# Patient Record
Sex: Male | Born: 1965 | Race: White | Hispanic: No | Marital: Married | Smoking: Former smoker
Health system: Southern US, Community
[De-identification: ages and names within clinical notes are randomized; demographics above are authoritative.]

## PROBLEM LIST (undated history)

## (undated) DIAGNOSIS — E079 Disorder of thyroid, unspecified: Secondary | ICD-10-CM

## (undated) DIAGNOSIS — R519 Headache, unspecified: Secondary | ICD-10-CM

## (undated) DIAGNOSIS — R51 Headache: Secondary | ICD-10-CM

## (undated) DIAGNOSIS — F32A Depression, unspecified: Secondary | ICD-10-CM

## (undated) DIAGNOSIS — F329 Major depressive disorder, single episode, unspecified: Secondary | ICD-10-CM

## (undated) DIAGNOSIS — M1990 Unspecified osteoarthritis, unspecified site: Secondary | ICD-10-CM

## (undated) DIAGNOSIS — K219 Gastro-esophageal reflux disease without esophagitis: Secondary | ICD-10-CM

## (undated) DIAGNOSIS — K2270 Barrett's esophagus without dysplasia: Secondary | ICD-10-CM

## (undated) DIAGNOSIS — K469 Unspecified abdominal hernia without obstruction or gangrene: Secondary | ICD-10-CM

## (undated) DIAGNOSIS — T7840XA Allergy, unspecified, initial encounter: Secondary | ICD-10-CM

## (undated) HISTORY — DX: Headache: R51

## (undated) HISTORY — DX: Unspecified osteoarthritis, unspecified site: M19.90

## (undated) HISTORY — DX: Barrett's esophagus without dysplasia: K22.70

## (undated) HISTORY — DX: Major depressive disorder, single episode, unspecified: F32.9

## (undated) HISTORY — DX: Allergy, unspecified, initial encounter: T78.40XA

## (undated) HISTORY — DX: Unspecified abdominal hernia without obstruction or gangrene: K46.9

## (undated) HISTORY — DX: Depression, unspecified: F32.A

## (undated) HISTORY — DX: Disorder of thyroid, unspecified: E07.9

## (undated) HISTORY — DX: Headache, unspecified: R51.9

## (undated) HISTORY — DX: Gastro-esophageal reflux disease without esophagitis: K21.9

---
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
## 2009-02-13 ENCOUNTER — Ambulatory Visit: Payer: Self-pay | Admitting: Family Medicine

## 2009-02-13 ENCOUNTER — Ambulatory Visit: Payer: Self-pay | Admitting: Interventional Radiology

## 2009-02-13 ENCOUNTER — Ambulatory Visit (HOSPITAL_BASED_OUTPATIENT_CLINIC_OR_DEPARTMENT_OTHER): Admission: RE | Admit: 2009-02-13 | Discharge: 2009-02-13 | Payer: Self-pay | Admitting: Family Medicine

## 2009-02-13 DIAGNOSIS — S1093XA Contusion of unspecified part of neck, initial encounter: Secondary | ICD-10-CM

## 2009-02-13 DIAGNOSIS — S0003XA Contusion of scalp, initial encounter: Secondary | ICD-10-CM | POA: Insufficient documentation

## 2009-02-13 DIAGNOSIS — S0083XA Contusion of other part of head, initial encounter: Secondary | ICD-10-CM

## 2009-07-20 ENCOUNTER — Ambulatory Visit: Payer: Self-pay | Admitting: Family

## 2009-07-20 ENCOUNTER — Telehealth: Payer: Self-pay | Admitting: Family

## 2009-07-20 ENCOUNTER — Ambulatory Visit (HOSPITAL_BASED_OUTPATIENT_CLINIC_OR_DEPARTMENT_OTHER): Admission: RE | Admit: 2009-07-20 | Discharge: 2009-07-20 | Payer: Self-pay | Admitting: Internal Medicine

## 2009-07-20 ENCOUNTER — Ambulatory Visit: Payer: Self-pay | Admitting: Diagnostic Radiology

## 2009-07-20 DIAGNOSIS — E039 Hypothyroidism, unspecified: Secondary | ICD-10-CM

## 2009-07-20 DIAGNOSIS — M1710 Unilateral primary osteoarthritis, unspecified knee: Secondary | ICD-10-CM | POA: Insufficient documentation

## 2009-07-20 LAB — CONVERTED CEMR LAB
BUN: 12 mg/dL (ref 6–23)
Basophils Absolute: 0 10*3/uL (ref 0.0–0.1)
Basophils Relative: 1 % (ref 0–1)
CO2: 26 meq/L (ref 19–32)
Calcium: 10.2 mg/dL (ref 8.4–10.5)
Chloride: 101 meq/L (ref 96–112)
Creatinine, Ser: 1.28 mg/dL (ref 0.40–1.50)
Eosinophils Absolute: 0.1 10*3/uL (ref 0.0–0.7)
Eosinophils Relative: 2 % (ref 0–5)
Glucose, Bld: 109 mg/dL — ABNORMAL HIGH (ref 70–99)
HCT: 45 % (ref 39.0–52.0)
Hemoglobin: 14.8 g/dL (ref 13.0–17.0)
Lymphocytes Relative: 34 % (ref 12–46)
Lymphs Abs: 2.8 10*3/uL (ref 0.7–4.0)
MCHC: 32.9 g/dL (ref 30.0–36.0)
MCV: 90.2 fL (ref 78.0–100.0)
Monocytes Absolute: 0.9 10*3/uL (ref 0.1–1.0)
Monocytes Relative: 10 % (ref 3–12)
Neutro Abs: 4.4 10*3/uL (ref 1.7–7.7)
Neutrophils Relative %: 53 % (ref 43–77)
Platelets: 237 10*3/uL (ref 150–400)
Potassium: 4.1 meq/L (ref 3.5–5.3)
RBC: 4.99 M/uL (ref 4.22–5.81)
RDW: 12.9 % (ref 11.5–15.5)
Sodium: 139 meq/L (ref 135–145)
TSH: 5.633 microintl units/mL — ABNORMAL HIGH (ref 0.350–4.500)
WBC: 8.3 10*3/uL (ref 4.0–10.5)

## 2009-07-23 ENCOUNTER — Encounter: Payer: Self-pay | Admitting: Family

## 2009-07-24 ENCOUNTER — Telehealth: Payer: Self-pay | Admitting: Family

## 2009-08-14 ENCOUNTER — Encounter: Payer: Self-pay | Admitting: Family

## 2009-08-14 LAB — CONVERTED CEMR LAB
Hgb A1c MFr Bld: 5.3 % (ref 4.6–6.1)
TSH: 3.444 microintl units/mL (ref 0.350–4.500)

## 2009-08-16 ENCOUNTER — Encounter: Payer: Self-pay | Admitting: Family

## 2009-08-21 ENCOUNTER — Ambulatory Visit: Payer: Self-pay | Admitting: Family

## 2009-08-21 DIAGNOSIS — L259 Unspecified contact dermatitis, unspecified cause: Secondary | ICD-10-CM

## 2009-09-21 ENCOUNTER — Telehealth (INDEPENDENT_AMBULATORY_CARE_PROVIDER_SITE_OTHER): Payer: Self-pay | Admitting: *Deleted

## 2009-09-21 ENCOUNTER — Encounter: Payer: Self-pay | Admitting: Family

## 2009-11-19 ENCOUNTER — Ambulatory Visit: Payer: Self-pay | Admitting: Family

## 2009-11-19 LAB — CONVERTED CEMR LAB: TSH: 1.375 microintl units/mL (ref 0.350–4.500)

## 2009-11-20 ENCOUNTER — Encounter: Payer: Self-pay | Admitting: Family

## 2010-06-28 ENCOUNTER — Telehealth: Payer: Self-pay | Admitting: Family

## 2010-07-03 ENCOUNTER — Ambulatory Visit: Payer: Self-pay | Admitting: Family

## 2010-07-03 DIAGNOSIS — K219 Gastro-esophageal reflux disease without esophagitis: Secondary | ICD-10-CM

## 2010-07-03 LAB — CONVERTED CEMR LAB: TSH: 2.295 microintl units/mL (ref 0.350–4.500)

## 2010-07-04 ENCOUNTER — Telehealth: Payer: Self-pay | Admitting: Family

## 2010-10-15 NOTE — Assessment & Plan Note (Signed)
Summary: Discuss HPV, THyroid check,- jr   Vital Signs:  Patient profile:   45 year old male Weight:      219 pounds BMI:     30.65 Temp:     98.0 degrees F oral Pulse rate:   72 / minute Pulse rhythm:   regular Resp:     16 per minute BP sitting:   100 / 78  (right arm) Cuff size:   regular  Vitals Entered By: Mervin Kung CMA (November 19, 2009 4:10 PM) CC: room 4  Needs thyroid checked.  Wife has been diagnosed with HPV.   Primary Care Provider:  Seymour Bars DO  CC:  room 4  Needs thyroid checked.  Wife has been diagnosed with HPV.Marland Kitchen  History of Present Illness: Mr Hidalgo is a 45 year old male with history of hypothyroidism.  Patient denies fatigue, heart palpitations. Has gained 5 lbs since since his last visit.  Has been taking synthroid.  No complaints.  Also wants to discuss HPV- tells me that his wife has hx of HPV and is s/p hysterectomy.  He denies any penile warts or lesions.    Allergies (verified): No Known Drug Allergies  Physical Exam  General:  Well-developed,well-nourished,in no acute distress; alert,appropriate and cooperative throughout examination Lungs:  Normal respiratory effort, chest expands symmetrically. Lungs are clear to auscultation, no crackles or wheezes. Heart:  Normal rate and regular rhythm. S1 and S2 normal without gallop, murmur, click, rub or other extra sounds.   Impression & Recommendations:  Problem # 1:  HYPOTHYROIDISM (ICD-244.9) Assessment Comment Only Will check TSH His updated medication list for this problem includes:    Levothroid 112 Mcg Tabs (Levothyroxine sodium) ..... 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  Notes right leg is worse than the left.  Will refer to ortho, plain films back in November were unremarkable. His updated medication list for this problem  includes:    Mobic 7.5 Mg Tabs (Meloxicam) ..... One tablet by mouth daily  Orders: Orthopedic Referral (Ortho)  Problem # 3:  COUNSELING OTHER SEXUALLY TRANSMITTED DISEASES (ICD-V65.45) Assessment: Comment Only Patient denies any penile lesions.  It is possible that he is a carrier for HPV and should keep this in mind if he has future sexual partners.  Other wise- I instructed patient to see [place] if he develops any penile warts/lesions.  He verbalizes understanding.  Complete Medication List: 1)  Omeprazole 40 Mg Cpdr (Omeprazole) .Marland Kitchen.. 1 by mouth daily 2)  Levothroid 112 Mcg Tabs (Levothyroxine sodium) .... One tablet by mouth daily 3)  Mobic 7.5 Mg Tabs (Meloxicam) .... One tablet by mouth daily  Patient Instructions: 1)  You will be contacted about your referral to Orthopedics.  2)  Pls complete your labs this afternoon.   Current Allergies (reviewed today): No known allergies

## 2010-10-15 NOTE — Progress Notes (Signed)
Summary: lab result, refill  Phone Note Outgoing Call   Summary of Call: Please call patient and let him know that his TSH is normal and I have sent rx refill to his pharmacy.  I am not changing his dose. Initial call taken by: Lemont Fillers FNP,  July 04, 2010 2:37 PM    Prescriptions: LEVOTHROID 112 MCG TABS (LEVOTHYROXINE SODIUM) one tablet by mouth daily  #90 x 1   Entered by:   Mervin Kung CMA (AAMA)   Authorized by:   Lemont Fillers FNP   Signed by:   Mervin Kung CMA (AAMA) on 07/05/2010   Method used:   Electronically to        [facility] (retail)             , [place]         Ph: [phone]       Fax: [phone]   RxID:   [id number] LEVOTHROID 112 MCG TABS (LEVOTHYROXINE SODIUM) one tablet by mouth daily  #90 x 1   Entered and Authorized by:   Lemont Fillers FNP   Signed by:   Lemont Fillers FNP on 07/04/2010   Method used:   Electronically to        [facility]. [phone]* (retail)       [address]       [place], [place]  [address]       Ph: [phone] or [phone]       Fax: [phone]   RxID:   [id number]

## 2010-10-15 NOTE — Progress Notes (Signed)
Summary: Please resend Rx. and refills to [facility]   Phone Note Refill Request Message from:  Patient on September 21, 2009 10:25 AM  Patient needs all his presriptions and refills resent to  [facility]. Their fax # is [phone], their Phone # is [phone].  Pt states if you can do 3 months at a time it will save him money. He no longer uses [facility] and has been switched to [facility] due to insurance  Initial call taken by: Michaelle Copas,  September 21, 2009 10:28 AM  Follow-up for Phone Call        filled out refill form.  [facility]- could you please fax to pharmacy.  Thanks Follow-up by: Lemont Fillers FNP,  September 21, 2009 12:29 PM  Additional Follow-up for Phone Call Additional follow up Details #1::        prescription faxed to [facility].Doristine Devoid  September 21, 2009 12:39 PM     Prescriptions: OMEPRAZOLE 40 MG CPDR (OMEPRAZOLE) 1 by mouth daily  #90 x 2   Entered by:   Doristine Devoid   Authorized by:   Lemont Fillers FNP   Signed by:   Doristine Devoid on 09/21/2009   Method used:   Historical   RxID:   [id number] MOBIC 7.5 MG TABS (MELOXICAM) one tablet by mouth daily  #90 x 2   Entered by:   Doristine Devoid   Authorized by:   Lemont Fillers FNP   Signed by:   Doristine Devoid on 09/21/2009   Method used:   Historical   RxID:   [id number] LEVOTHROID 112 MCG TABS (LEVOTHYROXINE SODIUM) one tablet by mouth daily  #90 x 2   Entered by:   Doristine Devoid   Authorized by:   Lemont Fillers FNP   Signed by:   Doristine Devoid on 09/21/2009   Method used:   Historical   RxID:   [id number]

## 2010-10-15 NOTE — Progress Notes (Signed)
Summary: refill --Levothroid  Phone Note Refill Request Message from:  Fax from Pharmacy on June 28, 2010 8:34 AM  Refills Requested: Medication #1:  LEVOTHROID 112 MCG TABS one tablet by mouth daily   Dosage confirmed as above?Dosage Confirmed   Brand Name Necessary? No   Supply Requested: 3 months   Last Refilled: 03/22/2010 [facility] [address] [place] [address] [phone] fax [phone] Pt last seen March 2011.   Method Requested: Electronic Next Appointment Scheduled: none Initial call taken by: Roselle Locus,  June 28, 2010 8:34 AM  Follow-up for Phone Call        May give 30 day supply only.  Please call pt to schedule follow up visit.  We need to recheck his thyroid studies to make sure that this is still the right dose for him. Follow-up by: Lemont Fillers FNP,  June 28, 2010 2:37 PM     Appended Document: refill --Levothroid Please call pt to arrange a follow up visit.   Appended Document: refill --Levothroid Spoke to pt. He scheduled appt with Melissa for tomorrow at 3:30pm. We will wait on refill until lab result comes back as pt states he has a weeks supply of med left.

## 2010-10-15 NOTE — Letter (Signed)
   [facility] [address] [place], [place] [address]    November 20, 2009   [facility] [address] [place], [place] [address]  RE:  LAB RESULTS  Dear  Mr. WIRT,  The following is an interpretation of your most recent lab tests.  Please take note of any instructions provided or changes to medications that have resulted from your lab work.  ELECTROLYTES:  Good - no changes needed  KIDNEY FUNCTION TESTS:  Good - no changes needed     Sincerely Yours,    Lemont Fillers FNP

## 2010-10-15 NOTE — Assessment & Plan Note (Signed)
Summary: follow up / tf,cma--Rm 4   Vital Signs:  Patient profile:   45 year old male Height:      71 inches Weight:      217 pounds BMI:     30.37 Temp:     97.8 degrees F oral Pulse rate:   78 / minute Pulse rhythm:   regular Resp:     16 per minute BP sitting:   110 / 78  (right arm) Cuff size:   large  Vitals Entered By: Mervin Kung CMA Duncan Dull) (July 03, 2010 3:17 PM) CC: Rm 4  follow up of hypothyroidism. Is Patient Diabetic? No Pain Assessment Patient in pain? no      Comments Pt will need refill on Levothroid if labs still in range. Prefers to have refills through [facility]. Mobic was stopped by Ortho. Pt states all other meds doses and directions are correct. Nicki Guadalajara Fergerson CMA Duncan Dull)  July 03, 2010 3:22 PM    Primary Care Provider:  Lemont Fillers FNP  CC:  Rm 4  follow up of hypothyroidism.Marland Kitchen  History of Present Illness: Terry Fox is a 45 year old male who presents today for follow up. He has no complaints today.  1. Hypothyroid- Pt has lost 2 pounds since his last visit.  Reports that he generally feels well, denies cold intolerance.  Appetite is good.  Denies swelling.  Taking levothroid daily as prescribed.    2. GERD- reports + chronic gerd symptoms prior to starting PPI, now well controlled.  Allergies (verified): No Known Drug Allergies  Past History:  Past Medical History: Last updated: 02/13/2009 GERD Hypothyroidism  [facility] [place] Depression in his 54s frequent HAs  Past Surgical History: Last updated: 02/13/2009 tonsillectomy 1980s  Review of Systems       see HPI  Physical Exam  General:  Well-developed,well-nourished,in no acute distress; alert,appropriate and cooperative throughout examination Lungs:  Normal respiratory effort, chest expands symmetrically. Lungs are clear to auscultation, no crackles or wheezes. Heart:  Normal rate and regular rhythm. S1 and S2 normal without gallop, murmur, click, rub or other  extra sounds. Extremities:  No peripheral edema   Impression & Recommendations:  Problem # 1:  HYPOTHYROIDISM (ICD-244.9) Assessment Comment Only Will check TSH, plan to continue levothroid. Flu shot today. His updated medication list for this problem includes:    Levothroid 112 Mcg Tabs (Levothyroxine sodium) ..... One tablet by mouth daily  Orders: TLB-TSH (Thyroid Stimulating Hormone) (84443-TSH)  Problem # 2:  GERD (ICD-530.81) Assessment: Improved Stable, continue PPI His updated medication list for this problem includes:    Omeprazole 40 Mg Cpdr (Omeprazole) .Marland Kitchen... 1 by mouth daily  Complete Medication List: 1)  Omeprazole 40 Mg Cpdr (Omeprazole) .Marland Kitchen.. 1 by mouth daily 2)  Levothroid 112 Mcg Tabs (Levothyroxine sodium) .... One tablet by mouth daily  Other Orders: Flu Vaccine 36yrs + (25956) Admin 1st Vaccine (38756)  Patient Instructions: 1)  Please complete your lab work downstairs. 2)  Follow up in 6 months, sooner if problems or concerns.   Orders Added: 1)  TLB-TSH (Thyroid Stimulating Hormone) [84443-TSH] 2)  Est. Patient Level III [43329] 3)  Flu Vaccine 50yrs + [90658] 4)  Admin 1st Vaccine [51884]   Immunizations Administered:  Influenza Vaccine # 1:    Vaccine Type: Fluvax 3+    Site: left deltoid    Mfr: GlaxoSmithKline    Dose: 0.5 ml    Route: IM    Given by: Glendell Docker CMA  Exp. Date: 03/15/2011    Lot #: [id number]    VIS given: 04/09/10 version given July 03, 2010.  Flu Vaccine Consent Questions:    Do you have a history of severe allergic reactions to this vaccine? no    Any prior history of allergic reactions to egg and/or gelatin? no    Do you have a sensitivity to the preservative Thimersol? no    Do you have a past history of Guillan-Barre Syndrome? no    Do you currently have an acute febrile illness? no    Have you ever had a severe reaction to latex? no    Vaccine information given and explained to patient?  yes   Immunizations Administered:  Influenza Vaccine # 1:    Vaccine Type: Fluvax 3+    Site: left deltoid    Mfr: GlaxoSmithKline    Dose: 0.5 ml    Route: IM    Given by: Glendell Docker CMA    Exp. Date: 03/15/2011    Lot #: [id number]    VIS given: 04/09/10 version given July 03, 2010.  Current Allergies (reviewed today): No known allergies

## 2010-10-15 NOTE — Medication Information (Signed)
Summary: Levothroid, Meloxicam, & Omeprazole/[facility]  Levothroid, Meloxicam, & Omeprazole/[facility]   Imported By: Lanelle Bal 09/26/2009 14:19:43  _____________________________________________________________________  External Attachment:    Type:   Image     Comment:   External Document

## 2010-12-27 ENCOUNTER — Ambulatory Visit (INDEPENDENT_AMBULATORY_CARE_PROVIDER_SITE_OTHER): Payer: PRIVATE HEALTH INSURANCE | Admitting: Family

## 2010-12-27 ENCOUNTER — Encounter: Payer: Self-pay | Admitting: Family

## 2010-12-27 ENCOUNTER — Ambulatory Visit: Payer: Self-pay | Admitting: Family

## 2010-12-27 VITALS — BP 100/70 | HR 67 | Temp 98.0°F | Resp 18 | Ht 71.0 in | Wt 216.0 lb

## 2010-12-27 DIAGNOSIS — L237 Allergic contact dermatitis due to plants, except food: Secondary | ICD-10-CM | POA: Insufficient documentation

## 2010-12-27 DIAGNOSIS — E039 Hypothyroidism, unspecified: Secondary | ICD-10-CM

## 2010-12-27 DIAGNOSIS — L255 Unspecified contact dermatitis due to plants, except food: Secondary | ICD-10-CM

## 2010-12-27 MED ORDER — BETAMETHASONE DIPROPIONATE 0.05 % EX CREA: TOPICAL_CREAM | Freq: Two times a day (BID) | CUTANEOUS | Status: DC

## 2010-12-27 NOTE — Assessment & Plan Note (Signed)
At this point, I do not think that he would benefit much from oral steroids as his symptoms are not that severe.  Will treat with topical steroid cream.

## 2010-12-27 NOTE — Progress Notes (Signed)
  Subjective:    Patient ID: Terry Fox, male    DOB: Feb 07, 1966, 45 y.o.   MRN: [id number]  HPI  Terry Fox is a 45 yr old male who presents today  with complaint of poison ivy.  Pt was doing yard work Saturday and used his gloves.  Tried to avoid the poison ivy in his yard.  He then developed rash on Monday.  Has been applying some benadryl cream which has helped.  Hypothyroidism- reports + compliance with levothyroxine.  Has lost 4 pounds.    Review of Systems See HPI  Past Medical History  Diagnosis Date  . GERD (gastroesophageal reflux disease)   . Thyroid disease     hypothyroidism  . Depression     in his 29's  . Generalized headaches     History   Social History  . Marital Status: Married    Spouse Name: N/A    Number of Children: 6  . Years of Education: N/A   Occupational History  . Retired Arts development officer    Social History Main Topics  . Smoking status: Never Smoker   . Smokeless tobacco: Current User  . Alcohol Use: Not on file  . Drug Use: Not on file  . Sexually Active: Not on file   Other Topics Concern  . Not on file   Social History Narrative  . No narrative on file    Past Surgical History  Procedure Date  . Tonsillectomy 1980's    Family History  Problem Relation Age of Onset  . Arthritis Father     No Known Allergies  Current Outpatient Prescriptions on File Prior to Visit  Medication Sig Dispense Refill  . levothyroxine (SYNTHROID, LEVOTHROID) 112 MCG tablet Take 112 mcg by mouth daily.        Marland Kitchen omeprazole (PRILOSEC) 40 MG capsule Take 40 mg by mouth daily.          BP 100/70  Pulse 67  Temp(Src) 98 F (36.7 C) (Oral)  Resp 18  Ht 5\' 11"  (1.803 m)  Wt 216 lb (97.977 kg)  BMI 30.13 kg/m2  SpO2 100%       Objective:   Physical Exam  Constitutional: He appears well-developed.  Eyes: Pupils are equal, round, and reactive to light.  Cardiovascular: Normal rate and regular rhythm.   Pulmonary/Chest: Effort normal and  breath sounds normal.  Skin:       + rash consistent with poison ivy noted on bilateral forearms R>L          Assessment & Plan:

## 2010-12-27 NOTE — Assessment & Plan Note (Signed)
Patient reports + compliance with his thyroid medication.  Will recheck TSH.

## 2010-12-27 NOTE — Patient Instructions (Addendum)
Call if your symptoms worsen or if they do not improve.  Follow up in 3 months for a complete physical.

## 2010-12-30 ENCOUNTER — Telehealth: Payer: Self-pay | Admitting: Family

## 2010-12-30 MED ORDER — LEVOTHYROXINE SODIUM 112 MCG PO TABS: 112.0000 ug | ORAL_TABLET | Freq: Every day | ORAL | Status: DC

## 2010-12-30 NOTE — Telephone Encounter (Signed)
Please call patient and let him know that his TSH is normal and I have sent refills on his synthroid to his pharmacy.

## 2010-12-31 NOTE — Telephone Encounter (Signed)
Left message on voicemail for pt to return my call.

## 2011-01-01 MED ORDER — LEVOTHYROXINE SODIUM 112 MCG PO TABS: 112.0000 ug | ORAL_TABLET | Freq: Every day | ORAL | Status: DC

## 2011-01-01 NOTE — Telephone Encounter (Signed)
Pt notified of normal TSH and Rx was sent to [facility] [place]. Pt states it should have gone to [facility] and he told [facility] to cancel Rx. Advised pt I have resent rx to [facility].

## 2011-01-06 ENCOUNTER — Ambulatory Visit (INDEPENDENT_AMBULATORY_CARE_PROVIDER_SITE_OTHER): Payer: PRIVATE HEALTH INSURANCE | Admitting: Family

## 2011-01-06 ENCOUNTER — Encounter: Payer: Self-pay | Admitting: Family

## 2011-01-06 DIAGNOSIS — G43909 Migraine, unspecified, not intractable, without status migrainosus: Secondary | ICD-10-CM

## 2011-01-06 MED ORDER — FROVATRIPTAN SUCCINATE 2.5 MG PO TABS: 2.5000 mg | ORAL_TABLET | ORAL | Status: DC | PRN

## 2011-01-06 NOTE — Assessment & Plan Note (Signed)
Will give trial of frova, pt instructed to call if no improvement.

## 2011-01-06 NOTE — Patient Instructions (Signed)
Migraine Headache   A migraine headache is an intense, throbbing pain on one or both sides of your head. The exact cause of a migraine headache is not always known. A migraine may be caused when nerves in the brain become irritated and release chemicals that cause swelling (inflammation) within blood vessels, causing pain. Many migraine sufferers have a family history of migraines. Before you get a migraine you may or may not get an aura. An aura is a group of symptoms that can predict the beginning of a migraine. An aura may include:   Visual changes such as:   Flashing lights.   Seeing bright spots or zig-zag lines.   Tunnel vision.   Feelings of numbness.   Trouble talking.   Muscle weakness.   SYMPTOMS OF A MIGRAINE   A migraine headache has one or more of the following symptoms:   Pain on one or both sides of your head.   Pain that is pulsating or throbbing in nature.   Pain that is severe enough to prevent daily activities.   Pain that is aggravated by any daily physical activity.   Nausea (feeling sick to your stomach), vomiting or both.   Pain with exposure to bright lights, loud noises or activity.   General sensitivity to bright lights or loud noises.   MIGRAINE TRIGGERS   A migraine headache can be triggered by many things. Examples of triggers include:   Alcohol.   Smoking.   Stress.   It may be related to menses (male menstruation).   Aged cheeses.   Foods or drinks that contain nitrates, glutamate, aspartame or tyramine.   Lack of sleep.   Chocolate.   Caffeine.   Hunger.   Medications such as nitroglycerine (used to treat chest pain), birth control pills, estrogen and some blood pressure medications.   DIAGNOSIS   A migraine headache is often diagnosed based on:   Your symptoms.   Physical examination.   A CT scan of your head may be ordered to see if your headaches are caused from other medical conditions.   HOME CARE INSTRUCTIONS   Medications can help prevent migraines if they are recurrent or  should they become recurrent. Your caregiver can help you with a medication or treatment program that will be helpful to you.   If you get a migraine, it may be helpful to lie down in a dark, quiet room.   It may be helpful to keep a headache diary. This may help you find a trend as to what may be triggering your headaches.   SEEK IMMEDIATE MEDICAL CARE IF:   You do not get relief from the medications given to you or you have a recurrence of pain.   You have confusion, personality changes or seizures.   You have headaches that wake you from sleep.   You have an increased frequency in your headaches.   You have a stiff neck.   You have a loss of vision.   You have muscle weakness.   You start losing your balance or have trouble walking.   You feel faint or pass out.   MAKE SURE YOU:   Understand these instructions.   Will watch your condition.   Will get help right away if you are not doing well or get worse.   Document Released: 09/01/2005 Document Re-Released: 06/29/2009   ExitCare® Patient Information ©2011 [employer].

## 2011-01-06 NOTE — Progress Notes (Signed)
  Subjective:    Patient ID: Terry Fox, male    DOB: [birth year], 45 y.o.   MRN: [id number]  HPI [name] is a 45 yr old male who presents today with cc of HA. HA started mid afternoon last Thursday (4.5 days ago).  Pain is 5/10.  Pain is located above the left eye- sometimes sharp shooting, other time throbbing/aching pain.  He has history of similar HA, but has never lasted this long.  He has tried tylenol, aleve BC powder, hydrocodone and a muscle relaxer without any improvement.  Denies associated photophobia or phonophobia.  + associated nausea without vomitting.  + dizziness.  Denies associated fever, neck stiffness, or sinus congestion.  Denies family history of migraines.  Denies associated tearing in the left eye.     Review of Systems    see HPI  Past Medical History  Diagnosis Date  . GERD (gastroesophageal reflux disease)   . Thyroid disease     hypothyroidism  . Depression     in his 48's  . Generalized headaches     History   Social History  . Marital Status: Married    Spouse Name: N/A    Number of Children: 6  . Years of Education: N/A   Occupational History  . Retired Arts development officer    Social History Main Topics  . Smoking status: Never Smoker   . Smokeless tobacco: Current User  . Alcohol Use: Not on file  . Drug Use: Not on file  . Sexually Active: Not on file   Other Topics Concern  . Not on file   Social History Narrative  . No narrative on file    Past Surgical History  Procedure Date  . Tonsillectomy 1980's    Family History  Problem Relation Age of Onset  . Arthritis Father     No Known Allergies  Current Outpatient Prescriptions on File Prior to Visit  Medication Sig Dispense Refill  . levothyroxine (SYNTHROID, LEVOTHROID) 112 MCG tablet Take 1 tablet (112 mcg total) by mouth daily.  90 tablet  1  . omeprazole (PRILOSEC) 40 MG capsule Take 40 mg by mouth daily.        Marland Kitchen DISCONTD: betamethasone dipropionate (DIPROLENE) 0.05 % cream  Apply topically 2 (two) times daily.  30 g  0    BP 110/78  Pulse 72  Temp(Src) 97.8 F (36.6 C) (Oral)  Resp 18  Ht 5\' 11"  (1.803 m)  Wt 214 lb 1.9 oz (97.124 kg)  BMI 29.86 kg/m2    Objective:   Physical Exam  Constitutional: He is oriented to person, place, and time. He appears well-developed and well-nourished.       Seated in chair, squinting out of the left eye.  HENT:  Head: Normocephalic and atraumatic.  Eyes: Conjunctivae are normal. Pupils are equal, round, and reactive to light.  Neck: Neck supple.  Cardiovascular: Normal rate and regular rhythm.   Pulmonary/Chest: Effort normal and breath sounds normal.  Neurological: He is oriented to person, place, and time. Coordination normal.  Psychiatric: He has a normal mood and affect. His behavior is normal.           Assessment & Plan:  frova 2.5 SP027 exp 07/2012 #4

## 2011-01-07 ENCOUNTER — Telehealth: Payer: Self-pay | Admitting: *Deleted

## 2011-01-07 MED ORDER — SUMATRIPTAN SUCCINATE 50 MG PO TABS: ORAL_TABLET | ORAL | Status: DC

## 2011-01-07 NOTE — Telephone Encounter (Signed)
He can try imitrex instead, r

## 2011-01-07 NOTE — Telephone Encounter (Signed)
Pt left message stating that Frova is too expensive. He would like a cheaper alternative. Please advise.

## 2011-01-08 ENCOUNTER — Telehealth: Payer: Self-pay | Admitting: *Deleted

## 2011-01-08 NOTE — Telephone Encounter (Signed)
Message copied by Mervin Kung on Wed Jan 08, 2011 11:54 AM ------      Message from: O'SULLIVAN, MELISSA      Created: Tue Jan 07, 2011  8:41 PM       Please seen phone note re: imitrex

## 2011-01-08 NOTE — Telephone Encounter (Signed)
Pt.notified

## 2011-01-08 NOTE — Telephone Encounter (Signed)
Left message on machine to return my call. 

## 2011-03-28 ENCOUNTER — Ambulatory Visit (INDEPENDENT_AMBULATORY_CARE_PROVIDER_SITE_OTHER): Payer: PRIVATE HEALTH INSURANCE | Admitting: Family

## 2011-03-28 ENCOUNTER — Encounter: Payer: Self-pay | Admitting: Family

## 2011-03-28 DIAGNOSIS — Z0000 Encounter for general adult medical examination without abnormal findings: Secondary | ICD-10-CM | POA: Insufficient documentation

## 2011-03-28 LAB — CBC
MCH: 29.6 pg (ref 26.0–34.0)
MCHC: 32.5 g/dL (ref 30.0–36.0)
Platelets: 220 10*3/uL (ref 150–400)

## 2011-03-28 LAB — LIPID PANEL
Cholesterol: 213 mg/dL — ABNORMAL HIGH (ref 0–200)
LDL Cholesterol: 130 mg/dL — ABNORMAL HIGH (ref 0–99)
Triglycerides: 249 mg/dL — ABNORMAL HIGH (ref <150)

## 2011-03-28 LAB — HEPATIC FUNCTION PANEL
ALT: 33 U/L (ref 0–53)
Alkaline Phosphatase: 83 U/L (ref 39–117)
Indirect Bilirubin: 1.4 mg/dL — ABNORMAL HIGH (ref 0.0–0.9)
Total Protein: 8 g/dL (ref 6.0–8.3)

## 2011-03-28 NOTE — Progress Notes (Signed)
Subjective:    Patient ID: Terry Fox, male    DOB: 15-Sep-1966, 45 y.o.   MRN: [id number]  HPI  Preventative-  No regular exercise.   Will consider walking routine. Last tetanus was 6 yrs ago. Diet has improved since he started bringing his lunch to work.    Review of Systems  Constitutional: Negative for fever.  HENT: Negative for ear pain and congestion.   Eyes: Negative for visual disturbance.  Respiratory: Negative for chest tightness.   Cardiovascular: Negative for chest pain, palpitations and leg swelling.  Gastrointestinal: Negative for diarrhea and constipation.  Genitourinary: Negative for urgency, frequency and decreased urine volume.  Musculoskeletal: Negative for joint swelling.       Ankle, knee pain  Neurological: Negative for weakness and numbness.  Hematological: Negative for adenopathy.  Psychiatric/Behavioral:       Denies anxiety or depression   Past Medical History  Diagnosis Date  . GERD (gastroesophageal reflux disease)   . Thyroid disease     hypothyroidism  . Depression     in his 40's  . Generalized headaches     History   Social History  . Marital Status: Married    Spouse Name: N/A    Number of Children: 6  . Years of Education: N/A   Occupational History  . Retired Arts development officer    Social History Main Topics  . Smoking status: Never Smoker   . Smokeless tobacco: Current User    Types: Chew  . Alcohol Use: Not on file  . Drug Use: Not on file  . Sexually Active: Not on file   Other Topics Concern  . 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 Tonsillectomy 1980's    Family History  Problem Relation Age of Onset  . Arthritis Father     No Known Allergies  Current Outpatient Prescriptions on File Prior to Visit  Medication Sig Dispense Refill  .  levothyroxine (SYNTHROID, LEVOTHROID) 112 MCG tablet Take 1 tablet (112 mcg total) by mouth daily.  90 tablet  1  . omeprazole (PRILOSEC) 40 MG capsule Take 40 mg by mouth daily.          BP 110/80  Pulse 60  Temp(Src) 98.3 F (36.8 C) (Oral)  Resp 16  Ht 5\' 11"  (1.803 m)  Wt 213 lb (96.616 kg)  BMI 29.71 kg/m2        Objective:   Physical Exam  Constitutional: He is oriented to person, place, and time. He appears well-developed and well-nourished.  HENT:  Head: Normocephalic and atraumatic.  Right Ear: Tympanic membrane and ear canal normal.  Left Ear: Tympanic membrane and ear canal normal.  Mouth/Throat: Uvula is midline, oropharynx is clear and moist and mucous membranes are normal.  Neck: Normal range of motion. Neck supple. No thyromegaly present.  Cardiovascular: Normal rate and regular rhythm.   Pulmonary/Chest: Effort normal and breath sounds normal.  Abdominal: Soft. Bowel sounds are normal.  Musculoskeletal: Normal range of motion. He exhibits no edema and no tenderness.  Neurological: He is alert and oriented to person, place, and time. He has normal reflexes.  Skin: Skin is warm and dry.  Psychiatric: He has a normal mood and affect. His behavior is normal. Thought content normal.          Assessment & Plan:

## 2011-03-28 NOTE — Patient Instructions (Signed)
Please complete your blood work on the first floor.  Follow up in 4 months (complete your Thyroid blood test one week prior).

## 2011-03-28 NOTE — Assessment & Plan Note (Signed)
45 year old male presents today for routine physical. No complete fasting laboratories, TSH was normal in April, we'll plan to have him check TSH prior to his upcoming visit. He was counseled on healthy diet, exercise, and weight loss. He was also counseled on cessation of his smokeless tobacco for 3-5 minutes. Immunizations reviewed and up-to-date.

## 2011-03-29 LAB — BASIC METABOLIC PANEL WITH GFR
Calcium: 10.5 mg/dL (ref 8.4–10.5)
GFR, Est African American: >60 mL/min (ref >60)
GFR, Est Non African American: >60 mL/min (ref >60)
Sodium: 142 mEq/L (ref 135–145)

## 2011-04-02 ENCOUNTER — Telehealth: Payer: Self-pay | Admitting: Family

## 2011-04-02 NOTE — Telephone Encounter (Signed)
Please call patient and let him know that his bilirubin (one of the liver test is elevated.) I would like to have him to return to the lab to complete the lab test that I have pended please.

## 2011-04-02 NOTE — Telephone Encounter (Signed)
Pt notified and states he will return to the lab for additional tests today. Orders printed and faxed to the lab.

## 2011-04-03 ENCOUNTER — Encounter: Payer: Self-pay | Admitting: Family

## 2011-04-03 LAB — ALPHA-1-ANTITRYPSIN: A-1 Antitrypsin, Ser: 118 mg/dL (ref 90–200)

## 2011-04-03 LAB — FERRITIN: Ferritin: 211 ng/mL (ref 22–322)

## 2011-04-03 LAB — IRON AND TIBC
%SAT: 26 % (ref 20–55)
TIBC: 373 ug/dL (ref 215–435)

## 2011-04-03 LAB — HEPATITIS B SURFACE ANTIGEN: Hepatitis B Surface Ag: NEGATIVE

## 2011-04-03 LAB — HEPATITIS B SURFACE ANTIBODY,QUALITATIVE: Hep B S Ab: NEGATIVE

## 2011-04-08 ENCOUNTER — Telehealth: Payer: Self-pay | Admitting: *Deleted

## 2011-04-08 DIAGNOSIS — E039 Hypothyroidism, unspecified: Secondary | ICD-10-CM

## 2011-04-08 NOTE — Telephone Encounter (Signed)
Message copied by Kathi Simpers on Tue Apr 08, 2011  2:25 PM ------      Message from: O'SULLIVAN, MELISSA      Created: Fri Mar 28, 2011  4:45 PM       Please sent TSH order for 4 months (hypothyroidism) thanks

## 2011-04-08 NOTE — Telephone Encounter (Signed)
TSH order entered and forwarded to the lab for the week of 07/28/11.

## 2011-04-09 ENCOUNTER — Telehealth: Payer: Self-pay | Admitting: *Deleted

## 2011-04-09 NOTE — Telephone Encounter (Signed)
Pt called requesting results of additional liver testing. Notified pt per 04/02/11 letter that all tests returned normal and he should be receiving a letter this week.

## 2011-04-17 NOTE — Telephone Encounter (Signed)
Pt due for TSH on 07/27/11.

## 2011-04-28 ENCOUNTER — Encounter: Payer: Self-pay | Admitting: Family

## 2011-04-28 ENCOUNTER — Ambulatory Visit (INDEPENDENT_AMBULATORY_CARE_PROVIDER_SITE_OTHER): Payer: PRIVATE HEALTH INSURANCE | Admitting: Family

## 2011-04-28 VITALS — BP 122/88 | HR 67 | Temp 98.2°F | Ht 71.0 in | Wt 213.0 lb

## 2011-04-28 DIAGNOSIS — K429 Umbilical hernia without obstruction or gangrene: Secondary | ICD-10-CM | POA: Insufficient documentation

## 2011-04-28 NOTE — Patient Instructions (Signed)
You will be contacted about your referral to see the surgeon about your hernia. Go to ER if you ever develop severe abdominal pain, nausea/vomitting.

## 2011-04-28 NOTE — Assessment & Plan Note (Signed)
New.  Small hernia, reduces easily.  Will refer to surgeon for evaluation.  We discussed cause of hernia and symptoms of incarcerated hernia.  Pt instructed to go to ED if he develops these symptoms.

## 2011-04-28 NOTE — Progress Notes (Signed)
  Subjective:    Patient ID: Terry Fox, male    DOB: 1966-07-14, 45 y.o.   MRN: [id number]  HPI  [name] is a 44 yr old male who presents today with chief complaint of protrusion above the "belly button."  First noticed1 week ago.  Sometimes it "goes away."  Mild tenderness.  Denies nausea, vomitting or abdominal pain. Denies previous hx of hermia.     Review of Systems    see HPI  Past Medical History  Diagnosis Date  . GERD (gastroesophageal reflux disease)   . Thyroid disease     hypothyroidism  . Depression     in his 36's  . Generalized headaches     History   Social History  . Marital Status: Married    Spouse Name: N/A    Number of Children: 6  . Years of Education: N/A   Occupational History  . Retired Arts development officer    Social History Main Topics  . Smoking status: Never Smoker   . Smokeless tobacco: Current User    Types: Chew  . Alcohol Use: Yes     6-8 beers weekly  . Drug Use: No  . Sexually Active: Yes   Other Topics Concern  . 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 Tonsillectomy 1980's    Family History  Problem Relation Age of Onset  . Arthritis Father     No Known Allergies  Current Outpatient Prescriptions on File Prior to Visit  Medication Sig Dispense Refill  . levothyroxine (SYNTHROID, LEVOTHROID) 112 MCG tablet Take 1 tablet (112 mcg total) by mouth daily.  90 tablet  1  . omeprazole (PRILOSEC) 40 MG capsule Take 40 mg by mouth daily.          BP 122/88  Pulse 67  Temp(Src) 98.2 F (36.8 C) (Oral)  Ht 5\' 11"  (1.803 m)  Wt 213 lb (96.616 kg)  BMI 29.71 kg/m2    Objective:   Physical Exam  Constitutional: He appears well-developed and well-nourished.  Cardiovascular: Normal rate and regular rhythm.   No murmur heard. Pulmonary/Chest: Effort  normal and breath sounds normal. No respiratory distress. He has no wheezes. He has no rales. He exhibits no tenderness.  Abdominal: Soft. Bowel sounds are normal. He exhibits no distension. There is no tenderness. There is no rebound and no guarding.       Small easily reducible supraumbilical hernia.           Assessment & Plan:

## 2011-05-12 ENCOUNTER — Ambulatory Visit (INDEPENDENT_AMBULATORY_CARE_PROVIDER_SITE_OTHER): Payer: PRIVATE HEALTH INSURANCE | Admitting: General Surgery

## 2011-05-12 ENCOUNTER — Encounter (INDEPENDENT_AMBULATORY_CARE_PROVIDER_SITE_OTHER): Payer: Self-pay | Admitting: General Surgery

## 2011-05-12 VITALS — BP 122/82 | HR 62 | Temp 97.7°F | Ht 71.5 in | Wt 212.4 lb

## 2011-05-12 DIAGNOSIS — K429 Umbilical hernia without obstruction or gangrene: Secondary | ICD-10-CM

## 2011-05-12 NOTE — Progress Notes (Signed)
Chief Complaint  Patient presents with  . Other    new pt- eval of umbilical hernia     HPI Terry Fox is a 45 y.o. male. HPI   This is a 45 year old male who noted an umbilical bulge about 3 weeks ago when he was taking a shower. He complains of occasional nausea that has been present for a long time. There's no episodes of emesis. He has no changes in his bowel movements. This area has been present and goes in and out on its own. He doesn't really bother him except for that it is there.  Past Medical History  Diagnosis Date  . GERD (gastroesophageal reflux disease)   . Thyroid disease     hypothyroidism  . Depression     in his 34's  . Generalized headaches   . Hernia     Past Surgical History  Procedure Date  . Tonsillectomy 1980's    Family History  Problem Relation Age of Onset  . Arthritis Father     Social History History  Substance Use Topics  . Smoking status: Former Smoker    Quit date: 10/11/1996  . Smokeless tobacco: Current User    Types: Chew  . Alcohol Use: 7.2 oz/week    12 Cans of beer per week     6-8 beers weekly    No Known Allergies  Current Outpatient Prescriptions  Medication Sig Dispense Refill  . levothyroxine (SYNTHROID, LEVOTHROID) 112 MCG tablet Take 1 tablet (112 mcg total) by mouth daily.  90 tablet  1  . omeprazole (PRILOSEC) 40 MG capsule Take 40 mg by mouth daily.          Review of Systems Review of Systems  Constitutional: Negative.   HENT: Negative.   Eyes: Negative.   Respiratory: Negative.   Cardiovascular: Negative.   Gastrointestinal: Negative.   Genitourinary: Negative.   Musculoskeletal: Positive for joint pain.  Skin: Negative.   Neurological: Negative.   Endo/Heme/Allergies: Negative.   Psychiatric/Behavioral: Negative.     Blood pressure 122/82, pulse 62, temperature 97.7 F (36.5 C), height 5' 11.5" (1.816 m), weight 212 lb 6 oz (96.333 kg).  Physical Exam Physical Exam  Constitutional: He  appears well-developed and well-nourished.  Cardiovascular: Normal rate, regular rhythm and normal heart sounds.   Respiratory: Effort normal and breath sounds normal. He has no wheezes. He has no rales.  GI: Soft. Bowel sounds are normal. He exhibits no distension. There is no tenderness. There is no guarding. A hernia (reducible nontender umbilical hernia) is present.   Assessment    Umbilical hernia    Plan    We discussed the pathophysiology of an umbilical hernia. I discussed observation versus repair. He would very much like to have this repaired. I discussed an open umbilical hernia repair with mesh. The risks include but are not limited to bleeding, infection, infection of the mesh, recurrence.       Taytum Scheck 05/12/2011, 5:17 PM

## 2011-05-13 ENCOUNTER — Telehealth: Payer: Self-pay | Admitting: *Deleted

## 2011-05-13 NOTE — Telephone Encounter (Signed)
Patient called and left voice message, stating he spoke with billing regarding charges for smoking cessation. His message stated that he informed Melissa that he did not smoke. He is requesting to have the charges of $29 removed.

## 2011-05-14 NOTE — Telephone Encounter (Signed)
Please let patient know that I have requested to have the charge removed.  However, the charge reflected counseling on discontinuation of his chewing tobacco not cigarettes.

## 2011-05-14 NOTE — Telephone Encounter (Signed)
Pt.notified

## 2011-06-02 ENCOUNTER — Ambulatory Visit (HOSPITAL_COMMUNITY)
Admission: RE | Admit: 2011-06-02 | Payer: PRIVATE HEALTH INSURANCE | Source: Ambulatory Visit | Admitting: General Surgery

## 2011-06-05 ENCOUNTER — Other Ambulatory Visit: Payer: Self-pay | Admitting: Family

## 2011-07-05 IMAGING — CR DG KNEE 1-2V*R*
2 series · 2 of 2 positions shown · non-contrast
Comparison: None

CLINICAL DATA: Knee pain.  Osteoarthritis.

RIGHT KNEE - 1-2 VIEW

[t knee ap right]
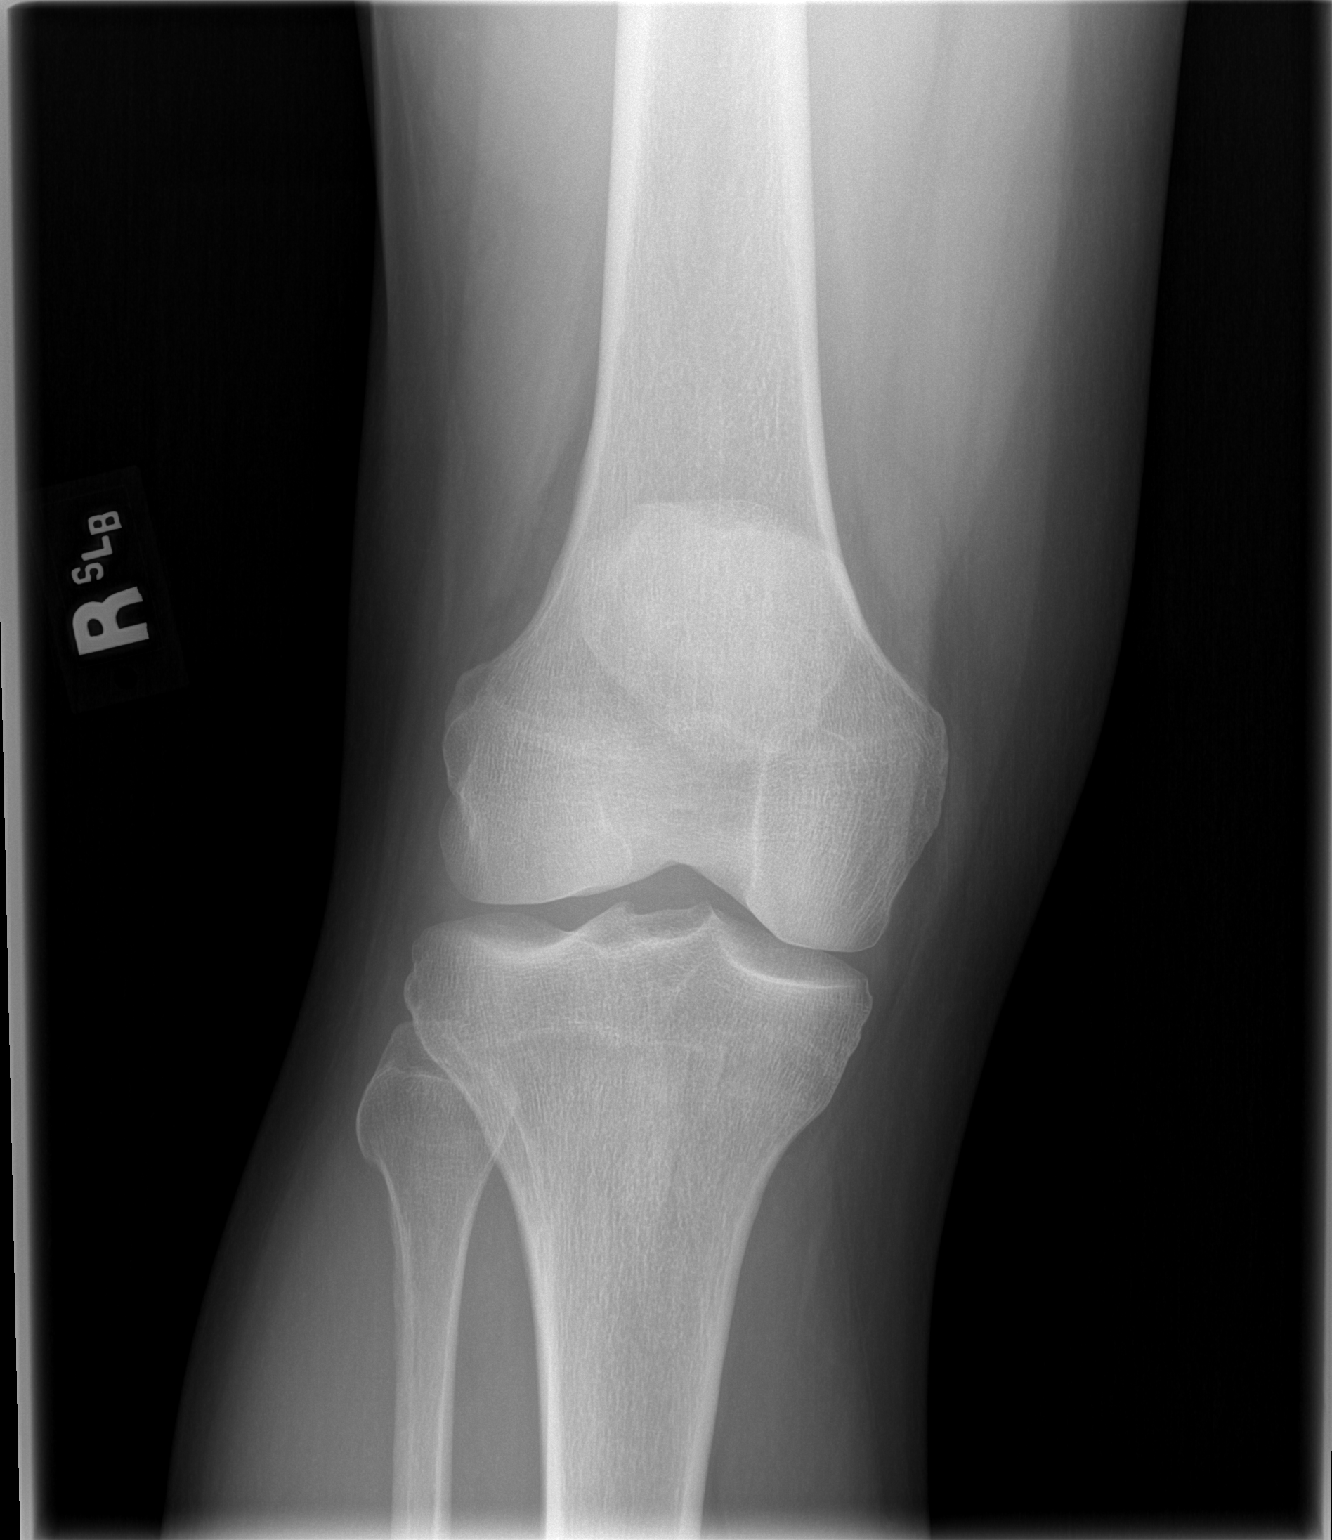

[t knee lat right]
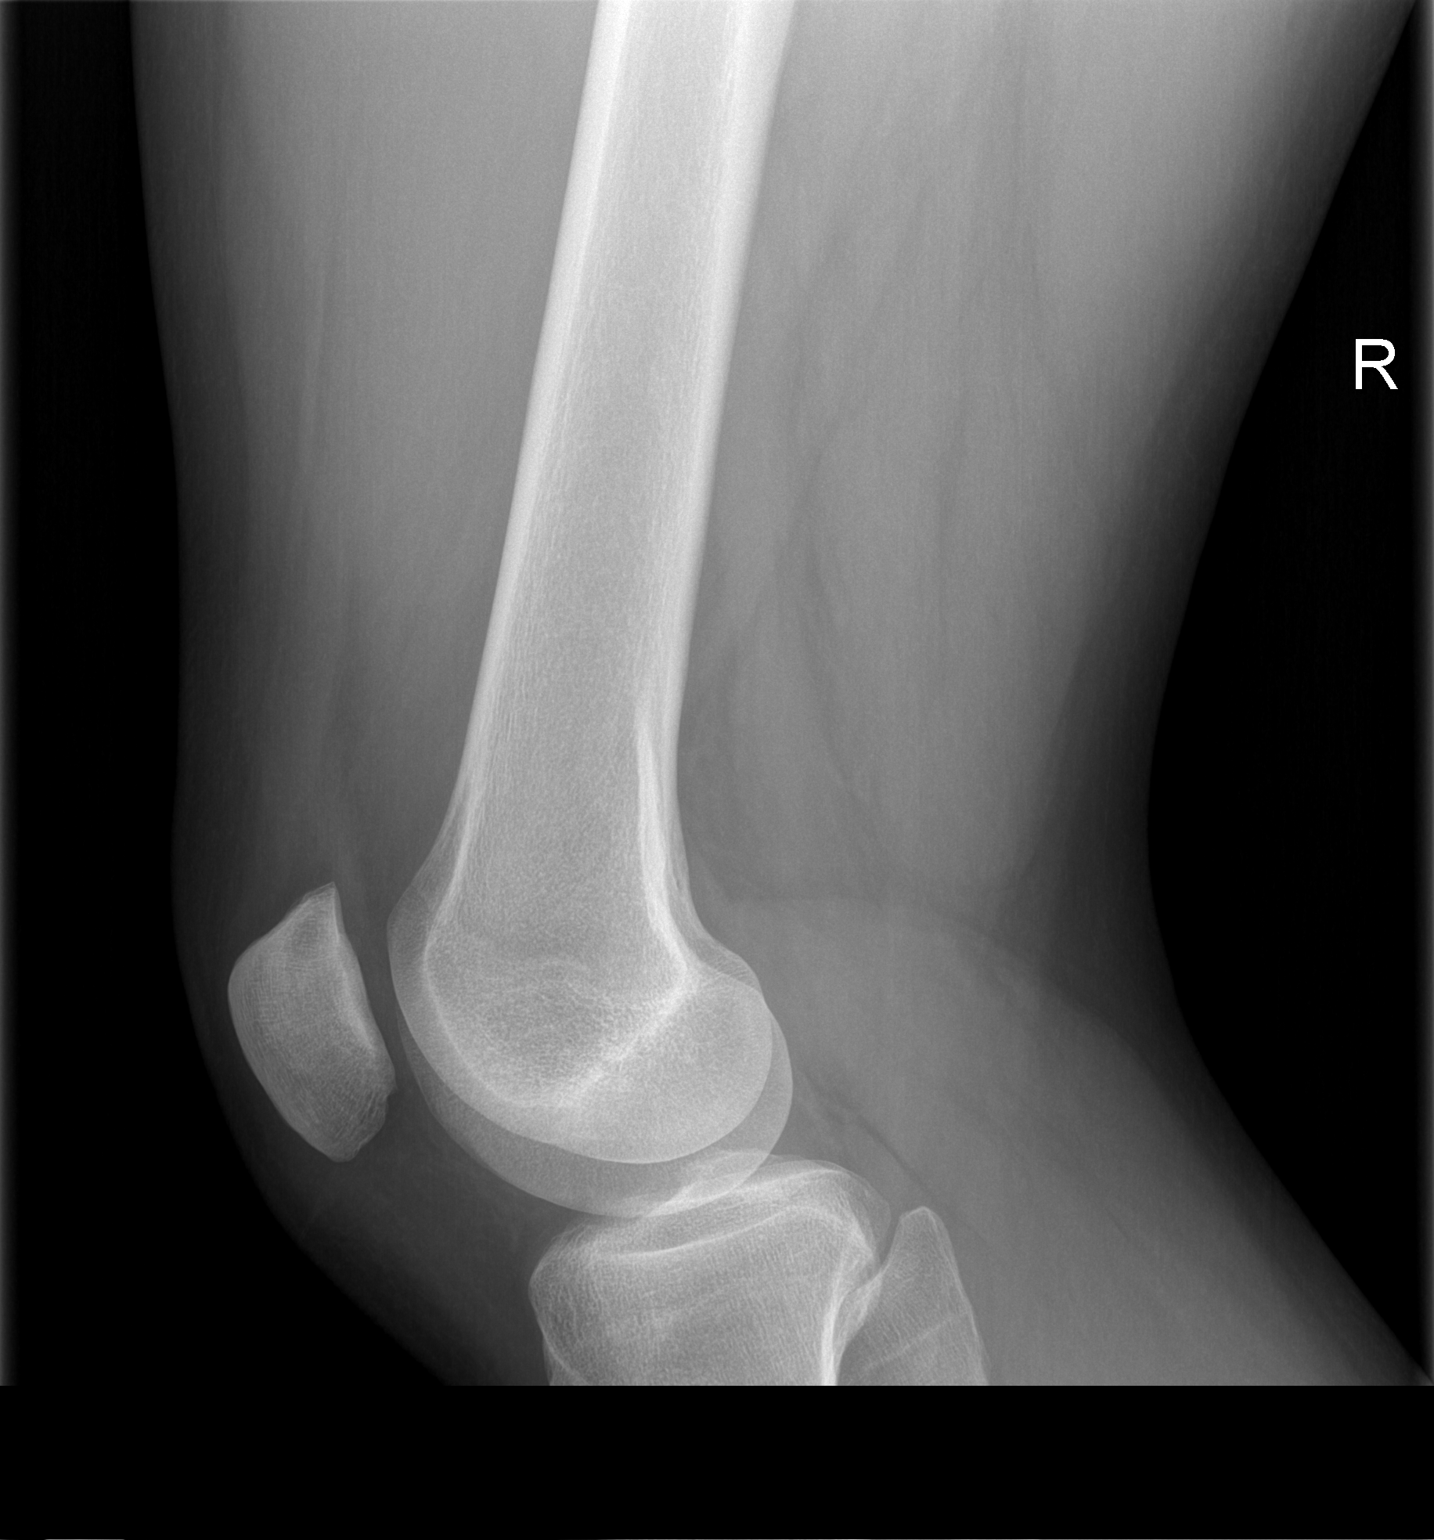

[2 of 2 positions shown; findings below may reference images not displayed]

FINDINGS: There is no evidence of fracture, dislocation, or joint
effusion.  There is no evidence of arthropathy or other focal bone
abnormality.  Soft tissues are unremarkable.
IMPRESSION: Negative.

## 2011-07-11 ENCOUNTER — Ambulatory Visit (HOSPITAL_COMMUNITY)
Admission: RE | Admit: 2011-07-11 | Payer: PRIVATE HEALTH INSURANCE | Source: Ambulatory Visit | Admitting: General Surgery

## 2011-07-21 ENCOUNTER — Other Ambulatory Visit (INDEPENDENT_AMBULATORY_CARE_PROVIDER_SITE_OTHER): Payer: Self-pay | Admitting: General Surgery

## 2011-08-01 ENCOUNTER — Encounter: Payer: Self-pay | Admitting: Family

## 2011-08-01 ENCOUNTER — Ambulatory Visit (INDEPENDENT_AMBULATORY_CARE_PROVIDER_SITE_OTHER): Payer: PRIVATE HEALTH INSURANCE | Admitting: Family

## 2011-08-01 VITALS — BP 110/80 | HR 72 | Temp 98.3°F | Resp 16 | Ht 71.0 in | Wt 213.0 lb

## 2011-08-01 DIAGNOSIS — K429 Umbilical hernia without obstruction or gangrene: Secondary | ICD-10-CM

## 2011-08-01 DIAGNOSIS — E039 Hypothyroidism, unspecified: Secondary | ICD-10-CM

## 2011-08-01 DIAGNOSIS — R748 Abnormal levels of other serum enzymes: Secondary | ICD-10-CM

## 2011-08-01 DIAGNOSIS — G43909 Migraine, unspecified, not intractable, without status migrainosus: Secondary | ICD-10-CM

## 2011-08-01 DIAGNOSIS — Z5181 Encounter for therapeutic drug level monitoring: Secondary | ICD-10-CM

## 2011-08-01 MED ORDER — OMEPRAZOLE 40 MG PO CPDR: 40.0000 mg | DELAYED_RELEASE_CAPSULE | Freq: Every day | ORAL | Status: DC

## 2011-08-01 MED ORDER — SUMATRIPTAN SUCCINATE 50 MG PO TABS: ORAL_TABLET | ORAL | Status: DC

## 2011-08-01 MED ORDER — TOPIRAMATE 50 MG PO TABS: 25.0000 mg | ORAL_TABLET | Freq: Two times a day (BID) | ORAL | Status: DC

## 2011-08-01 NOTE — Assessment & Plan Note (Signed)
Deteriorated.  Will add topamax and PRN imitrex. Check bicarb/creatinine today.

## 2011-08-01 NOTE — Patient Instructions (Addendum)
Please complete your lab work prior to leaving today.  Imitrex 25mg  at bedtime for 1 week, then increase to twice daily.  Follow up in 2 months sooner if problems or concerns.

## 2011-08-01 NOTE — Assessment & Plan Note (Signed)
Repeat LFTs today.

## 2011-08-01 NOTE — Assessment & Plan Note (Signed)
Check TSH, continue synthroid. 

## 2011-08-01 NOTE — Assessment & Plan Note (Signed)
Unchanged, he is considering surgi

## 2011-08-01 NOTE — Progress Notes (Signed)
  Subjective:    Patient ID: Terry Fox, male    DOB: [birth year], 44 y.o.   MRN: [id number]  HPI  [name] is a 45 yr old male who presents today for follow up.  1) Hypothyroid- He continues the synthroid. Denies any problems with the levothyroxine. He denies heat intolerance,chills or palpitations. His weight remains stable.   2) GERD-he continues omeprazole.   3) umbilical hernia- this was evaluated by Dr. Dwain Sarna. He is holding off on surgery.  4) HA's- report that they last for hours "no matter what I take." He is getting headaches 1-2 times a week.  Denies associated nausea.  + photophobia, no significant phonophobia.  He has been taking tylenol, BC powders, mobic PRN.    Pt had his flu shot at work.    Review of Systems See HPI  Past Medical History  Diagnosis Date  . GERD (gastroesophageal reflux disease)   . Thyroid disease     hypothyroidism  . Depression     in his 53's  . Generalized headaches   . Hernia     History   Social History  . Marital Status: Married    Spouse Name: N/A    Number of Children: 6  . Years of Education: N/A   Occupational History  . Retired Arts development officer    Social History Main Topics  . Smoking status: Former Smoker    Quit date: 10/11/1996  . Smokeless tobacco: Current User    Types: Chew  . Alcohol Use: 7.2 oz/week    12 Cans of beer per week     6-8 beers weekly  . Drug Use: No  . Sexually Active: Yes   Other Topics Concern  . 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 Tonsillectomy 1980's    Family History  Problem Relation Age of Onset  . Arthritis Father     No Known Allergies  Current Outpatient Prescriptions on File Prior to Visit  Medication Sig Dispense Refill  . levothyroxine (SYNTHROID, LEVOTHROID) 112 MCG tablet Take 1 tablet  (112 mcg total) by mouth daily.  90 tablet  1    BP 110/80  Pulse 72  Temp(Src) 98.3 F (36.8 C) (Oral)  Resp 16  Ht 5\' 11"  (1.803 m)  Wt 213 lb (96.616 kg)  BMI 29.71 kg/m2       Objective:   Physical Exam  Constitutional: He appears well-developed and well-nourished.  HENT:  Head: Normocephalic and atraumatic.  Neck: Normal range of motion. Neck supple. No thyromegaly present.  Cardiovascular: Normal rate and regular rhythm.   Pulmonary/Chest: Effort normal and breath sounds normal. No respiratory distress. He has no wheezes. He has no rales. He exhibits no tenderness.  Psychiatric: He has a normal mood and affect. His behavior is normal. Judgment and thought content normal.          Assessment & Plan:

## 2011-08-02 LAB — HEPATIC FUNCTION PANEL
Albumin: 4.7 g/dL (ref 3.5–5.2)
Indirect Bilirubin: 0.9 mg/dL (ref 0.0–0.9)
Total Protein: 7.7 g/dL (ref 6.0–8.3)

## 2011-08-02 LAB — BASIC METABOLIC PANEL
BUN: 15 mg/dL (ref 6–23)
Chloride: 105 mEq/L (ref 96–112)
Potassium: 4.8 mEq/L (ref 3.5–5.3)

## 2011-08-02 LAB — TSH: TSH: 0.602 u[IU]/mL (ref 0.350–4.500)

## 2011-08-06 ENCOUNTER — Telehealth: Payer: Self-pay | Admitting: Family

## 2011-08-06 DIAGNOSIS — E039 Hypothyroidism, unspecified: Secondary | ICD-10-CM

## 2011-08-06 MED ORDER — LEVOTHYROXINE SODIUM 100 MCG PO TABS: 100.0000 ug | ORAL_TABLET | Freq: Every day | ORAL | Status: DC

## 2011-08-06 NOTE — Telephone Encounter (Signed)
Please call pt and let him know that his liver function tests are normal.  I would like to decrease his thyroid medication from to and have him repeat tsh in 6 weeks.  Rx has been sent to his pharmacy.

## 2011-08-06 NOTE — Telephone Encounter (Signed)
Call placed to patient at [phone], he was advised per Sandford Craze instructions and has verbalized understanding. Lab order placed for December 2012.

## 2011-09-26 ENCOUNTER — Other Ambulatory Visit: Payer: Self-pay | Admitting: Family

## 2011-10-03 ENCOUNTER — Ambulatory Visit (INDEPENDENT_AMBULATORY_CARE_PROVIDER_SITE_OTHER): Payer: PRIVATE HEALTH INSURANCE | Admitting: Family

## 2011-10-03 ENCOUNTER — Encounter: Payer: Self-pay | Admitting: Family

## 2011-10-03 DIAGNOSIS — R748 Abnormal levels of other serum enzymes: Secondary | ICD-10-CM

## 2011-10-03 DIAGNOSIS — G43909 Migraine, unspecified, not intractable, without status migrainosus: Secondary | ICD-10-CM

## 2011-10-03 DIAGNOSIS — E039 Hypothyroidism, unspecified: Secondary | ICD-10-CM

## 2011-10-03 DIAGNOSIS — K219 Gastro-esophageal reflux disease without esophagitis: Secondary | ICD-10-CM

## 2011-10-03 MED ORDER — LEVOTHYROXINE SODIUM 100 MCG PO TABS: 100.0000 ug | ORAL_TABLET | Freq: Every day | ORAL | Status: DC

## 2011-10-03 NOTE — Assessment & Plan Note (Signed)
He reports that migraines are manageable. Topamax did not help.  Wants to continue with current regimen of prn aleve or imitrex.

## 2011-10-03 NOTE — Assessment & Plan Note (Signed)
Stable on prilosec, continue same.  ?

## 2011-10-03 NOTE — Progress Notes (Signed)
  Subjective:    Patient ID: Terry Fox, male    DOB: 24-Sep-1965, 46 y.o.   MRN: [id number]  HPI  [name] is a 46 yr old male who presents today for follow up.  Migraines-  Reports HA's twice a week.  Reports that if he catches it early, then an Aleve will help.  Otherwise, he takes imitrex which usually works for him.    Hypothyroid- Tolerting synthroid, feels well.   Umbilical hernia- Has not been causing him any discomfort.   GERD-  Reports well controlled with prilosec.     Review of Systems See HPI  Past Medical History  Diagnosis Date  . GERD (gastroesophageal reflux disease)   . Thyroid disease     hypothyroidism  . Depression     in his 35's  . Generalized headaches   . Hernia     History   Social History  . Marital Status: Married    Spouse Name: N/A    Number of Children: 6  . Years of Education: N/A   Occupational History  . Retired Arts development officer    Social History Main Topics  . Smoking status: Former Smoker    Quit date: 10/11/1996  . Smokeless tobacco: Current User    Types: Chew  . Alcohol Use: 7.2 oz/week    12 Cans of beer per week     6-8 beers weekly  . Drug Use: No  . Sexually Active: Yes   Other Topics Concern  . 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 Tonsillectomy 1980's    Family History  Problem Relation Age of Onset  . Arthritis Father     No Known Allergies  Current Outpatient Prescriptions on File Prior to Visit  Medication Sig Dispense Refill  . naproxen sodium (ALEVE) 220 MG tablet Take 220 mg by mouth daily as needed.        Marland Kitchen omeprazole (PRILOSEC) 40 MG capsule Take 1 capsule (40 mg total) by mouth daily.  90 capsule  1  . SUMAtriptan (IMITREX) 50 MG tablet One tab at start of migraine, may repeat in 2 hrs as needed. Max 2 tabs/day  10  tablet  2    BP 116/78  Pulse 66  Temp(Src) 98.9 F (37.2 C) (Oral)  Resp 16  Ht 5\' 11"  (1.803 m)  Wt 210 lb 1.9 oz (95.31 kg)  BMI 29.31 kg/m2       Objective:   Physical Exam  Constitutional: He appears well-developed and well-nourished. No distress.  HENT:  Head: Normocephalic and atraumatic.  Cardiovascular: Normal rate and regular rhythm.   No murmur heard. Pulmonary/Chest: Effort normal and breath sounds normal. No respiratory distress. He has no wheezes. He has no rales. He exhibits no tenderness.  Abdominal: Soft. Bowel sounds are normal.       Small easily reducible inguinal hernia is noted.   Psychiatric: He has a normal mood and affect. His behavior is normal. Judgment and thought content normal.          Assessment & Plan:

## 2011-10-03 NOTE — Assessment & Plan Note (Signed)
Follow up LFT's were normal. Monitor.

## 2011-10-03 NOTE — Assessment & Plan Note (Signed)
TSH is normal, continue current dose of levothyroxine.

## 2011-10-03 NOTE — Patient Instructions (Signed)
Please follow up in 6 months, sooner if problems or concerns.  

## 2011-11-07 ENCOUNTER — Encounter: Payer: Self-pay | Admitting: Family

## 2011-11-07 ENCOUNTER — Ambulatory Visit (INDEPENDENT_AMBULATORY_CARE_PROVIDER_SITE_OTHER): Payer: PRIVATE HEALTH INSURANCE | Admitting: Family

## 2011-11-07 DIAGNOSIS — G43909 Migraine, unspecified, not intractable, without status migrainosus: Secondary | ICD-10-CM

## 2011-11-07 MED ORDER — KETOROLAC TROMETHAMINE 30 MG/ML IJ SOLN
60.0000 mg | Freq: Once | INTRAMUSCULAR | Status: AC
  Administered 2011-11-07: 60 mg via INTRAMUSCULAR

## 2011-11-07 NOTE — Patient Instructions (Signed)
Please call if your symptoms worsen or if you are your symptoms do not improve.

## 2011-11-07 NOTE — Progress Notes (Signed)
Subjective:    Patient ID: Terry Fox, male    DOB: 1966/07/22, 46 y.o.   MRN: [id number]  HPI  [name] is a 46 yr old male who presents today with chief complaint of Headache. Symptoms started last night and he reports that pain is present on the left side of his head.  He denies nausea, or phonophobia, but does report photophobia. Pt reports that his HA is similar to previous migraines.  He has not yet taken any imitrex.  He has not taken any otc products today, but did take some aleve and BC powder yesterday without relief. Review of Systems    see HPI  Past Medical History  Diagnosis Date  . GERD (gastroesophageal reflux disease)   . Thyroid disease     hypothyroidism  . Depression     in his 74's  . Generalized headaches   . Hernia     History   Social History  . Marital Status: Married    Spouse Name: N/A    Number of Children: 6  . Years of Education: N/A   Occupational History  . Retired Arts development officer    Social History Main Topics  . Smoking status: Former Smoker    Quit date: 10/11/1996  . Smokeless tobacco: Current User    Types: Chew  . Alcohol Use: 7.2 oz/week    12 Cans of beer per week     6-8 beers weekly  . Drug Use: No  . Sexually Active: Yes   Other Topics Concern  . 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 Tonsillectomy 1980's    Family History  Problem Relation Age of Onset  . Arthritis Father     No Known Allergies  Current Outpatient Prescriptions on File Prior to Visit  Medication Sig Dispense Refill  . levothyroxine (SYNTHROID, LEVOTHROID) 100 MCG tablet Take 1 tablet (100 mcg total) by mouth daily.  90 tablet  1  . omeprazole (PRILOSEC) 40 MG capsule Take 1 capsule (40 mg total) by mouth daily.  90 capsule  1  . SUMAtriptan (IMITREX) 50 MG tablet One tab  at start of migraine, may repeat in 2 hrs as needed. Max 2 tabs/day  10 tablet  2  . naproxen sodium (ALEVE) 220 MG tablet Take 220 mg by mouth daily as needed.          BP 124/80  Pulse 63  Temp(Src) 98.3 F (36.8 C) (Oral)  Resp 16  Wt 210 lb 0.6 oz (95.274 kg)  SpO2 98%    Objective:   Physical Exam  Constitutional: He appears well-developed and well-nourished. No distress.  HENT:  Head: Normocephalic and atraumatic.  Right Ear: Tympanic membrane and ear canal normal.  Left Ear: Tympanic membrane and ear canal normal.  Mouth/Throat: No posterior oropharyngeal edema or posterior oropharyngeal erythema.  Neck: Normal range of motion. Neck supple.  Cardiovascular: Normal rate and regular rhythm.   No murmur heard. Pulmonary/Chest: Effort normal and breath sounds normal. No respiratory distress. He has no wheezes. He has no rales. He exhibits no tenderness.  Musculoskeletal: He exhibits no edema.  Lymphadenopathy:    He has no cervical adenopathy.  Skin: Skin is warm and dry. No erythema.  Psychiatric: He has a normal mood and affect. His behavior is normal. Judgment and thought content  normal.          Assessment & Plan:

## 2011-11-07 NOTE — Assessment & Plan Note (Signed)
IM toradol given in the office today, to be followed by imitrex dose after he returns home.  He is instructed to call if symptoms worsen, or if no improvement. Pt verbalizes understanding.

## 2011-12-07 ENCOUNTER — Other Ambulatory Visit: Payer: Self-pay | Admitting: Family

## 2012-04-02 ENCOUNTER — Ambulatory Visit: Payer: PRIVATE HEALTH INSURANCE | Admitting: Family

## 2012-04-06 ENCOUNTER — Ambulatory Visit: Payer: PRIVATE HEALTH INSURANCE | Admitting: Family

## 2012-04-08 ENCOUNTER — Ambulatory Visit (INDEPENDENT_AMBULATORY_CARE_PROVIDER_SITE_OTHER): Payer: PRIVATE HEALTH INSURANCE | Admitting: Family

## 2012-04-08 ENCOUNTER — Encounter: Payer: Self-pay | Admitting: Family

## 2012-04-08 VITALS — BP 104/78 | HR 60 | Temp 98.2°F | Resp 16 | Ht 71.0 in | Wt 207.0 lb

## 2012-04-08 DIAGNOSIS — K219 Gastro-esophageal reflux disease without esophagitis: Secondary | ICD-10-CM

## 2012-04-08 DIAGNOSIS — E039 Hypothyroidism, unspecified: Secondary | ICD-10-CM

## 2012-04-08 DIAGNOSIS — G43909 Migraine, unspecified, not intractable, without status migrainosus: Secondary | ICD-10-CM

## 2012-04-08 MED ORDER — VERAPAMIL HCL 120 MG PO TABS: 120.0000 mg | ORAL_TABLET | Freq: Every day | ORAL | Status: DC

## 2012-04-08 MED ORDER — LEVOTHYROXINE SODIUM 100 MCG PO TABS: 100.0000 ug | ORAL_TABLET | Freq: Every day | ORAL | Status: DC

## 2012-04-08 NOTE — Patient Instructions (Addendum)
Please complete your lab work prior to leaving today. Follow up in 2 weeks for nurse visit- blood pressure check. Follow up in 3 months for a regular visit.

## 2012-04-08 NOTE — Progress Notes (Signed)
  Subjective:    Patient ID: Terry Fox, male    DOB: 1965/09/18, 46 y.o.   MRN: [id number]  HPI  Migraines- reports that the [place] put him on verapamil. He reports that his headaches are improved on the verapamil and he is requesting a refill.  Hypothyroid-  Reports that he continues synthroid.    GERD- reports that this is well controlled on omeprazole.    Review of Systems See HPI  Past Medical History  Diagnosis Date  . GERD (gastroesophageal reflux disease)   . Thyroid disease     hypothyroidism  . Depression     in his 19's  . Generalized headaches   . Hernia     History   Social History  . Marital Status: Married    Spouse Name: N/A    Number of Children: 6  . Years of Education: N/A   Occupational History  . Retired Arts development officer    Social History Main Topics  . Smoking status: Former Smoker    Quit date: 10/11/1996  . Smokeless tobacco: Current User    Types: Chew  . Alcohol Use: 7.2 oz/week    12 Cans of beer per week     6-8 beers weekly  . Drug Use: No  . Sexually Active: Yes   Other Topics Concern  . 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 Tonsillectomy 1980's    Family History  Problem Relation Age of Onset  . Arthritis Father     No Known Allergies  Current Outpatient Prescriptions on File Prior to Visit  Medication Sig Dispense Refill  . omeprazole (PRILOSEC) 40 MG capsule Take 1 capsule (40 mg total) by mouth daily.  90 capsule  1  . verapamil (CALAN) 120 MG tablet Take 1 tablet (120 mg total) by mouth daily. [facility]  90 tablet  1  . levothyroxine (SYNTHROID, LEVOTHROID) 125 MCG tablet Take 1 tablet (125 mcg total) by mouth daily.  30 tablet  1  . naproxen sodium (ALEVE) 220 MG tablet Take 220 mg by mouth daily as needed.        . SUMAtriptan  (IMITREX) 50 MG tablet TAKE 1 TABLET BY MOUTH AT START OF MIGRAINE. MAY REPEAT IN 2 HOURS AS NEEDED . MAY OF 2 TABLETS PER DAY  10 tablet  4    BP 104/78  Pulse 60  Temp 98.2 F (36.8 C) (Oral)  Resp 16  Ht 5\' 11"  (1.803 m)  Wt 207 lb (93.895 kg)  BMI 28.87 kg/m2  SpO2 100%       Objective:   Physical Exam  Constitutional: He appears well-developed and well-nourished. No distress.  HENT:  Head: Normocephalic and atraumatic.  Neck: No thyromegaly present.  Cardiovascular: Normal rate and regular rhythm.   No murmur heard. Pulmonary/Chest: Effort normal and breath sounds normal. No respiratory distress. He has no wheezes. He has no rales. He exhibits no tenderness.  Skin: Skin is warm and dry.  Psychiatric: He has a normal mood and affect. His behavior is normal. Judgment and thought content normal.          Assessment & Plan:

## 2012-04-12 ENCOUNTER — Other Ambulatory Visit: Payer: Self-pay | Admitting: Family

## 2012-04-12 DIAGNOSIS — E039 Hypothyroidism, unspecified: Secondary | ICD-10-CM

## 2012-04-12 MED ORDER — LEVOTHYROXINE SODIUM 125 MCG PO TABS: 125.0000 ug | ORAL_TABLET | Freq: Every day | ORAL | Status: DC

## 2012-04-12 NOTE — Telephone Encounter (Signed)
Pls call pt and let him know that his lab test shows that we need to increase his synthroid.  Pended below- pls send to pharmacy of his choice.  He should repeat TSH in 6 weeks (hypothyroid)

## 2012-04-12 NOTE — Telephone Encounter (Signed)
Pt notified, rx sent to [facility]. Future lab order placed for the week of 05/24/12. Copy given to the lab and mailed to the pt as reminder.

## 2012-04-14 NOTE — Assessment & Plan Note (Signed)
Stable on omeprazole, continue same. 

## 2012-04-14 NOTE — Assessment & Plan Note (Signed)
Improved with verapamil. Continue same.

## 2012-04-14 NOTE — Assessment & Plan Note (Signed)
Clinically stable. Check TSH.  

## 2012-04-23 ENCOUNTER — Ambulatory Visit: Payer: PRIVATE HEALTH INSURANCE

## 2012-04-30 ENCOUNTER — Ambulatory Visit: Payer: PRIVATE HEALTH INSURANCE

## 2012-05-07 ENCOUNTER — Ambulatory Visit (INDEPENDENT_AMBULATORY_CARE_PROVIDER_SITE_OTHER): Payer: PRIVATE HEALTH INSURANCE | Admitting: *Deleted

## 2012-05-07 VITALS — BP 126/72

## 2012-05-07 DIAGNOSIS — G43909 Migraine, unspecified, not intractable, without status migrainosus: Secondary | ICD-10-CM

## 2012-05-07 NOTE — Progress Notes (Signed)
  Subjective:    Patient ID: Terry Fox, male    DOB: 10-18-1965, 46 y.o.   MRN: [id number]  HPI    Review of Systems     Objective:   Physical Exam        Assessment & Plan:  BP ok on verapamil. Continue same.

## 2012-05-12 ENCOUNTER — Other Ambulatory Visit: Payer: Self-pay | Admitting: Family

## 2012-05-14 NOTE — Telephone Encounter (Signed)
Received refill request of Omeprazole from [facility] mail order. Verified with pt that he wants to continue to use mail order. Refills sent, #90 x 1 refill.

## 2012-05-24 NOTE — Addendum Note (Signed)
Addended by: Mervin Kung A on: 05/24/2012 11:06 AM   Modules accepted: Orders

## 2012-05-24 NOTE — Telephone Encounter (Signed)
Pt presented to the lab and future order released. 

## 2012-05-25 ENCOUNTER — Telehealth: Payer: Self-pay | Admitting: Family

## 2012-05-25 MED ORDER — LEVOTHYROXINE SODIUM 125 MCG PO TABS: 125.0000 ug | ORAL_TABLET | Freq: Every day | ORAL | Status: DC

## 2012-05-25 NOTE — Telephone Encounter (Signed)
Please call pt and let him know that TSH is normal.  He should continue synthroid .  I have sent 90 day supply to [facility].

## 2012-05-25 NOTE — Telephone Encounter (Signed)
Notified pt. 

## 2012-07-09 ENCOUNTER — Ambulatory Visit (INDEPENDENT_AMBULATORY_CARE_PROVIDER_SITE_OTHER): Payer: PRIVATE HEALTH INSURANCE | Admitting: Family

## 2012-07-09 ENCOUNTER — Encounter: Payer: Self-pay | Admitting: Family

## 2012-07-09 VITALS — BP 110/68 | HR 68 | Temp 98.4°F | Resp 12 | Ht 71.0 in | Wt 205.0 lb

## 2012-07-09 DIAGNOSIS — K219 Gastro-esophageal reflux disease without esophagitis: Secondary | ICD-10-CM

## 2012-07-09 DIAGNOSIS — E039 Hypothyroidism, unspecified: Secondary | ICD-10-CM

## 2012-07-09 DIAGNOSIS — G43909 Migraine, unspecified, not intractable, without status migrainosus: Secondary | ICD-10-CM

## 2012-07-09 NOTE — Patient Instructions (Addendum)
Please schedule a follow up appointment in 6 months.   

## 2012-07-09 NOTE — Assessment & Plan Note (Signed)
Stable on omeprazole, continue same. 

## 2012-07-09 NOTE — Progress Notes (Signed)
  Subjective:    Patient ID: Terry Fox, male    DOB: [birth year], 46 y.o.   MRN: [id number]  HPI  Terry Fox is a 46 yr old male who presents today for follow up.  1) hypothyroid- last TSH reading was within normal limits in September. He continues levothyroxine without problems.   2) GERD- Pt continues omeprazole. Stable.   3) Migraine- Continues verapamil.  He reports rare migraines now since starting the verapamil.  Rarely needing imitrex.     Review of Systems See HPI  Past Medical History  Diagnosis Date  . GERD (gastroesophageal reflux disease)   . Thyroid disease     hypothyroidism  . Depression     in his 46's  . Generalized headaches   . Hernia     History   Social History  . Marital Status: Married    Spouse Name: N/A    Number of Children: 6  . Years of Education: N/A   Occupational History  . Retired Arts development officer    Social History Main Topics  . Smoking status: Former Smoker    Quit date: 10/11/1996  . Smokeless tobacco: Current User    Types: Chew  . Alcohol Use: 7.2 oz/week    12 Cans of beer per week     6-8 beers weekly  . Drug Use: No  . Sexually Active: Yes   Other Topics Concern  . 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 Tonsillectomy 1980's    Family History  Problem Relation Age of Onset  . Arthritis Father     No Known Allergies  Current Outpatient Prescriptions on File Prior to Visit  Medication Sig Dispense Refill  . levothyroxine (SYNTHROID, LEVOTHROID) 125 MCG tablet Take 1 tablet (125 mcg total) by mouth daily.  90 tablet  1  . omeprazole (PRILOSEC) 40 MG capsule TAKE 1 CAPSULE (40MG ) BY MOUTH DAILY  90 capsule  1  . verapamil (CALAN) 120 MG tablet Take 1 tablet (120 mg total) by mouth daily. [facility]  90 tablet  1  . aspirin 81  MG tablet Take 81 mg by mouth daily.      . naproxen sodium (ALEVE) 220 MG tablet Take 220 mg by mouth daily as needed.        . SUMAtriptan (IMITREX) 50 MG tablet TAKE 1 TABLET BY MOUTH AT START OF MIGRAINE. MAY REPEAT IN 2 HOURS AS NEEDED . MAY OF 2 TABLETS PER DAY  10 tablet  4    BP 110/68  Pulse 68  Temp 98.4 F (36.9 C) (Oral)  Resp 12  Ht 5\' 11"  (1.803 m)  Wt 205 lb 0.6 oz (93.006 kg)  BMI 28.60 kg/m2  SpO2 98%       Objective:   Physical Exam  Constitutional: He appears well-developed and well-nourished. No distress.  Neck: No thyromegaly present.  Cardiovascular: Normal rate and regular rhythm.   No murmur heard. Pulmonary/Chest: Effort normal and breath sounds normal. No respiratory distress. He has no wheezes. He has no rales. He exhibits no tenderness.  Psychiatric: He has a normal mood and affect. His behavior is normal. Judgment and thought content normal.          Assessment & Plan:

## 2012-07-09 NOTE — Assessment & Plan Note (Signed)
Improved with verapamil for prophylaxis. Continue same.

## 2012-07-09 NOTE — Assessment & Plan Note (Signed)
TSH 1.9 in September. Clinically well on current dose of levothyroxine. Continue same.

## 2012-07-26 ENCOUNTER — Encounter: Payer: Self-pay | Admitting: Internal Medicine

## 2012-07-26 ENCOUNTER — Ambulatory Visit (INDEPENDENT_AMBULATORY_CARE_PROVIDER_SITE_OTHER): Payer: PRIVATE HEALTH INSURANCE | Admitting: Internal Medicine

## 2012-07-26 ENCOUNTER — Ambulatory Visit (HOSPITAL_BASED_OUTPATIENT_CLINIC_OR_DEPARTMENT_OTHER)
Admission: RE | Admit: 2012-07-26 | Discharge: 2012-07-26 | Disposition: A | Discharge status: Home / Self Care | Payer: No Typology Code available for payment source | Source: Ambulatory Visit | Attending: Internal Medicine | Admitting: Internal Medicine

## 2012-07-26 VITALS — BP 116/72 | HR 71 | Temp 98.1°F | Resp 18 | Wt 204.5 lb

## 2012-07-26 DIAGNOSIS — M79609 Pain in unspecified limb: Secondary | ICD-10-CM | POA: Insufficient documentation

## 2012-07-26 DIAGNOSIS — S8990XA Unspecified injury of unspecified lower leg, initial encounter: Secondary | ICD-10-CM

## 2012-07-26 DIAGNOSIS — M79606 Pain in leg, unspecified: Secondary | ICD-10-CM

## 2012-07-26 DIAGNOSIS — S8991XA Unspecified injury of right lower leg, initial encounter: Secondary | ICD-10-CM

## 2012-07-26 MED ORDER — OXYCODONE-ACETAMINOPHEN 5-325 MG PO TABS: 1.0000 | ORAL_TABLET | Freq: Four times a day (QID) | ORAL | Status: DC | PRN

## 2012-07-27 ENCOUNTER — Ambulatory Visit (INDEPENDENT_AMBULATORY_CARE_PROVIDER_SITE_OTHER): Payer: PRIVATE HEALTH INSURANCE | Admitting: Family Medicine

## 2012-07-27 VITALS — BP 117/81 | Ht 72.0 in | Wt 204.0 lb

## 2012-07-27 DIAGNOSIS — S8991XA Unspecified injury of right lower leg, initial encounter: Secondary | ICD-10-CM

## 2012-07-27 DIAGNOSIS — S8990XA Unspecified injury of unspecified lower leg, initial encounter: Secondary | ICD-10-CM

## 2012-07-27 NOTE — Patient Instructions (Addendum)
Your exam is very reassuring. You did suffer a mild ankle sprain, peroneal muscle/tendon strain, and tweaked your knee. These should all resolve within the next 3-4 weeks however. Ice painful areas 15 minutes at a time 3-4 times a day as needed. Cane as needed to help get around. Aleve 2 tabs twice a day with food for pain and inflammation. Oxycodone as needed for severe pain. Elevate leg above the level of your heart when possible. Start home exercises: Straight leg raises, hip side raises, calf raises, and outside ankle strengthening exercises - 3 sets of 10 each once a day for next 4-6 weeks. Follow up with me in 1 month or as needed.

## 2012-07-28 ENCOUNTER — Encounter: Payer: Self-pay | Admitting: Family Medicine

## 2012-07-28 DIAGNOSIS — S8991XA Unspecified injury of right lower leg, initial encounter: Secondary | ICD-10-CM | POA: Insufficient documentation

## 2012-07-28 NOTE — Assessment & Plan Note (Signed)
consistent with ankle sprain, peroneal strain, mild knee sprain without tear (meniscal tear possible, less likely though).  Should resolve over next 3-4 weeks.  Shown home exercise program.  Icing, elevation, cane to help with ambulation.  Nsaids, oxycodone.  F/u in 1 month or as needed.

## 2012-07-28 NOTE — Progress Notes (Signed)
Subjective:    Patient ID: Terry Fox, male    DOB: 02-24-1966, 46 y.o.   MRN: [id number]  PCP: Sandford Craze  HPI 46 yo M here for right leg injury.  Patient reports on Sunday morning 11/10 he felt right ankle roll causing him to develop lateral right ankle pain, radiation up to knee and knee pain. Couldn't bear weight yesterday as a result. Improved last night though and feels better now. Knee feels like it's giving a little. No catching, locking. Has history of knee problems. Icing, elevating since this injury. Radiographs negative for fracture or arthritis though these were not weight bearing.  Past Medical History  Diagnosis Date  . GERD (gastroesophageal reflux disease)   . Thyroid disease     hypothyroidism  . Depression     in his 105's  . Generalized headaches   . Hernia     Current Outpatient Prescriptions on File Prior to Visit  Medication Sig Dispense Refill  . levothyroxine (SYNTHROID, LEVOTHROID) 125 MCG tablet Take 1 tablet (125 mcg total) by mouth daily.  90 tablet  1  . omeprazole (PRILOSEC) 40 MG capsule TAKE 1 CAPSULE (40MG ) BY MOUTH DAILY  90 capsule  1  . oxyCODONE-acetaminophen (ROXICET) 5-325 MG per tablet Take 1 tablet by mouth every 6 (six) hours as needed for pain.  20 tablet  0  . verapamil (CALAN) 120 MG tablet Take 1 tablet (120 mg total) by mouth daily. [facility]  90 tablet  1  . aspirin 81 MG tablet Take 81 mg by mouth daily.      . naproxen sodium (ALEVE) 220 MG tablet Take 220 mg by mouth daily as needed.        . SUMAtriptan (IMITREX) 50 MG tablet TAKE 1 TABLET BY MOUTH AT START OF MIGRAINE. MAY REPEAT IN 2 HOURS AS NEEDED . MAY OF 2 TABLETS PER DAY  10 tablet  4    Past Surgical History  Procedure Date  . Tonsillectomy 1980's    No Known Allergies  History   Social History  . Marital Status: Married    Spouse Name: N/A    Number of Children: 6  . Years of Education: N/A   Occupational History  . Retired  Arts development officer    Social History Main Topics  . Smoking status: Former Smoker    Quit date: 10/11/1996  . Smokeless tobacco: Current User    Types: Chew  . Alcohol Use: 7.2 oz/week    12 Cans of beer per week     Comment: 6-8 beers weekly  . Drug Use: No  . Sexually Active: Yes   Other Topics Concern  . Not on file   Social History Narrative   Regular exercise:  NoCaffeine Use:  RarelyWorks as a Armed forces logistics/support/administrative officer.Smokeless tobaccoMarried2 biological children4 step children    Family History  Problem Relation Age of Onset  . Arthritis Father   . Hyperlipidemia Mother   . Diabetes Neg Hx   . Heart attack Neg Hx   . Hypertension Neg Hx   . Sudden death Neg Hx     BP 117/81  Ht 6' (1.829 m)  Wt 204 lb (92.534 kg)  BMI 27.67 kg/m2  Review of Systems See HPI above.    Objective:   Physical Exam Gen: NAD  R foot/ankle: No gross deformity, swelling, ecchymoses FROM with pain on ext rotation, pain with resisted ER as well but 5/5 strength. TTP ATFL, peroneal tendons.  No malleolar,  base 5th, navicular or other foot/ankle TTP. Trace ant drawer and talar tilt - mild pain with talar tilt. Negative syndesmotic compression. Thompsons test negative. NV intact distally.  R knee: No gross deformity, ecchymoses.  Minimal effusion confirmed by ultrasound. TTP lateral and medial joint lines, mild.  No post patellar facet TTP. FROM. Negative ant/post drawers. Negative valgus/varus testing. Negative lachmanns. Negative mcmurrays, apleys, patellar apprehension, clarkes. NV intact distally.    Assessment & Plan:  1. Right lower leg injury - consistent with ankle sprain, peroneal strain, mild knee sprain without tear (meniscal tear possible, less likely though).  Should resolve over next 3-4 weeks.  Shown home exercise program.  Icing, elevation, cane to help with ambulation.  Nsaids, oxycodone.  F/u in 1 month or as needed.

## 2012-07-31 NOTE — Assessment & Plan Note (Signed)
Obtain plain x-ray of right lower leg with focus on fibula. Given Percocet as needed for pain. Caution regarding possible sedating effect, addiction and tolerance.

## 2012-07-31 NOTE — Progress Notes (Signed)
  Subjective:    Patient ID: Terry Fox, male    DOB: [birth year], 46 y.o.   MRN: [id number]  HPI patient presents to clinic for evaluation of the pain. With one day history of right leg pain from the level of the ankle up past the knee level. Occurred after injury while running. No back pain or neuropathic changes. Has significant pain on weightbearing. Taking no medication for the problem. No alleviating or exacerbating factors.  Past Medical History  Diagnosis Date  . GERD (gastroesophageal reflux disease)   . Thyroid disease     hypothyroidism  . Depression     in his 20's  . Generalized headaches   . Hernia    Past Surgical History  Procedure Date  . Tonsillectomy 1980's    reports that he quit smoking about 15 years ago. His smokeless tobacco use includes Chew. He reports that he drinks about 7.2 ounces of alcohol per week. He reports that he does not use illicit drugs. family history includes Arthritis in his father and Hyperlipidemia in his mother.  There is no history of Diabetes, and Heart attack, and Hypertension, and Sudden death, . No Known Allergies   Review of Systems see history of present illness     Objective:   Physical Exam  Nursing note and vitals reviewed. Constitutional: He appears well-developed and well-nourished. No distress.  HENT:  Head: Normocephalic and atraumatic.  Musculoskeletal:       Full Range of motion right leg. In the distal weight bearing location of pain generally in the lateral lower leg. Palpable area of the fibula nontender. No bony abnormality.  Skin: Skin is warm and dry. He is not diaphoretic.          Assessment & Plan:

## 2012-11-25 ENCOUNTER — Other Ambulatory Visit: Payer: Self-pay | Admitting: Family

## 2013-01-07 ENCOUNTER — Ambulatory Visit (INDEPENDENT_AMBULATORY_CARE_PROVIDER_SITE_OTHER): Payer: PRIVATE HEALTH INSURANCE | Admitting: Family

## 2013-01-07 ENCOUNTER — Encounter: Payer: Self-pay | Admitting: Family

## 2013-01-07 VITALS — BP 116/80 | HR 64 | Temp 98.3°F | Resp 16 | Ht 71.0 in | Wt 213.0 lb

## 2013-01-07 DIAGNOSIS — E039 Hypothyroidism, unspecified: Secondary | ICD-10-CM

## 2013-01-07 DIAGNOSIS — G43909 Migraine, unspecified, not intractable, without status migrainosus: Secondary | ICD-10-CM

## 2013-01-07 LAB — TSH: TSH: 10.177 u[IU]/mL — ABNORMAL HIGH (ref 0.350–4.500)

## 2013-01-07 MED ORDER — OMEPRAZOLE 40 MG PO CPDR: DELAYED_RELEASE_CAPSULE | ORAL | Status: DC

## 2013-01-07 NOTE — Progress Notes (Signed)
Subjective:    Patient ID: Terry Fox, male    DOB: 1965/10/20, 47 y.o.   MRN: [id number]  HPI  Terry Fox is a 47 yr old male who presents today for follow up.  Headaches- reports that migraines had improved on verapamil.  Recently since he has been going outside to mow the lawn   Hypothyroid- reports feeling well on current dose of synthroid Review of Systems    see HPI  Past Medical History  Diagnosis Date  . GERD (gastroesophageal reflux disease)   . Thyroid disease     hypothyroidism  . Depression     in his 75's  . Generalized headaches   . Hernia     History   Social History  . Marital Status: Married    Spouse Name: N/A    Number of Children: 6  . Years of Education: N/A   Occupational History  . Retired Arts development officer    Social History Main Topics  . Smoking status: Former Smoker    Quit date: 10/11/1996  . Smokeless tobacco: Current User    Types: Chew  . Alcohol Use: 7.2 oz/week    12 Cans of beer per week     Comment: 6-8 beers weekly  . Drug Use: No  . Sexually Active: Yes   Other Topics Concern  . Not on file   Social History Narrative   Regular exercise:  No   Caffeine Use:  Rarely   Works as a Armed forces logistics/support/administrative officer.   Smokeless tobacco   Married   2 biological children   4 step children             Past Surgical History  Procedure Laterality Date  . Tonsillectomy  1980's    Family History  Problem Relation Age of Onset  . Arthritis Father   . Hyperlipidemia Mother   . Diabetes Neg Hx   . Heart attack Neg Hx   . Hypertension Neg Hx   . Sudden death Neg Hx     No Known Allergies  Current Outpatient Prescriptions on File Prior to Visit  Medication Sig Dispense Refill  . levothyroxine (SYNTHROID, LEVOTHROID) 125 MCG tablet Take 1 tablet (125 mcg total) by mouth daily.  90 tablet  1  . naproxen sodium (ALEVE) 220 MG tablet Take 220 mg by mouth daily as needed.        . verapamil (CALAN) 120 MG tablet TAKE 1 TABLET (120MG  TOTAL) BY  MOUTH DAILY  90 tablet  1  . aspirin 81 MG tablet Take 81 mg by mouth daily.       No current facility-administered medications on file prior to visit.    BP 116/80  Pulse 64  Temp(Src) 98.3 F (36.8 C) (Oral)  Resp 16  Ht 5\' 11"  (1.803 m)  Wt 213 lb 0.6 oz (96.634 kg)  BMI 29.73 kg/m2  SpO2 97%    Objective:   Physical Exam  Constitutional: He is oriented to person, place, and time. He appears well-developed and well-nourished. No distress.  Cardiovascular: Normal rate and regular rhythm.   No murmur heard. Pulmonary/Chest: Effort normal and breath sounds normal. No respiratory distress. He has no wheezes. He has no rales. He exhibits no tenderness.  Neurological: He is alert and oriented to person, place, and time.  Skin:  Mild excoriation left ankle, no significant erythema  Psychiatric: He has a normal mood and affect. His behavior is normal. Judgment and thought content normal.  Assessment & Plan:

## 2013-01-07 NOTE — Patient Instructions (Addendum)
Please complete lab work prior to leaving. Call if you develop increased redness, swelling left lower leg or if it does not continue to improve.  Follow up in 3 months for a complete physical.

## 2013-01-09 NOTE — Assessment & Plan Note (Addendum)
Clinically stable on synthroid, continue same. Obtain tsh. 

## 2013-01-09 NOTE — Assessment & Plan Note (Signed)
Improved on verapamil. Continue same.

## 2013-01-10 ENCOUNTER — Telehealth: Payer: Self-pay | Admitting: Family

## 2013-01-10 DIAGNOSIS — E039 Hypothyroidism, unspecified: Secondary | ICD-10-CM

## 2013-01-10 MED ORDER — LEVOTHYROXINE SODIUM 150 MCG PO TABS: 150.0000 ug | ORAL_TABLET | Freq: Every day | ORAL | Status: DC

## 2013-01-10 NOTE — Telephone Encounter (Signed)
LMOM with contact name and number for return call RE: results and further provider instructions/SLS  

## 2013-01-10 NOTE — Telephone Encounter (Signed)
Pls call pt and let him know that his lab work shows synthroid needs to be increased to .  Rx sent to [facility]. He should repeat TSH in 6 weeks please (dx hypothyroid)

## 2013-01-11 NOTE — Telephone Encounter (Signed)
Notified pt and he voices understanding. Future order placed.

## 2013-03-01 ENCOUNTER — Telehealth: Payer: Self-pay | Admitting: Family

## 2013-03-01 NOTE — Telephone Encounter (Signed)
Please call pt and let him know that his thyroid testing is improving.  I would like him to continue synthroid 6 days a week.  Cut in half and take 1/2 tab one day a week.  We will recheck TSH at his 8/1 apt.

## 2013-03-01 NOTE — Telephone Encounter (Signed)
Notified pt with Mellissa response....lmb

## 2013-04-15 ENCOUNTER — Ambulatory Visit (INDEPENDENT_AMBULATORY_CARE_PROVIDER_SITE_OTHER): Payer: PRIVATE HEALTH INSURANCE | Admitting: Family

## 2013-04-15 ENCOUNTER — Encounter: Payer: Self-pay | Admitting: Family

## 2013-04-15 VITALS — BP 118/86 | HR 66 | Temp 98.2°F | Resp 16 | Ht 71.0 in | Wt 216.1 lb

## 2013-04-15 DIAGNOSIS — E039 Hypothyroidism, unspecified: Secondary | ICD-10-CM

## 2013-04-15 DIAGNOSIS — G43909 Migraine, unspecified, not intractable, without status migrainosus: Secondary | ICD-10-CM

## 2013-04-15 LAB — TSH: TSH: 7.752 u[IU]/mL — ABNORMAL HIGH (ref 0.350–4.500)

## 2013-04-15 MED ORDER — VERAPAMIL HCL 120 MG PO TABS: 120.0000 mg | ORAL_TABLET | Freq: Every day | ORAL | Status: DC

## 2013-04-15 NOTE — Assessment & Plan Note (Signed)
Clinically stable. Check TSH. Continue current dosing.

## 2013-04-15 NOTE — Patient Instructions (Addendum)
Please complete your lab work prior to leaving.  Follow up in 6 months, sooner if problems or concerns.

## 2013-04-15 NOTE — Assessment & Plan Note (Signed)
Stable on Calan, continue same. BP looks good.

## 2013-04-15 NOTE — Progress Notes (Signed)
  Subjective:    Patient ID: Terry Fox, male    DOB: 1966-04-04, 47 y.o.   MRN: [id number]  HPI  Hypothyroid- reports feeling well on current dosing regimen.  Migraines- reports that he did have migraine today but overall improved control.  Review of Systems See HPI  Past Medical History  Diagnosis Date  . GERD (gastroesophageal reflux disease)   . Thyroid disease     hypothyroidism  . Depression     in his 75's  . Generalized headaches   . Hernia     History   Social History  . Marital Status: Married    Spouse Name: N/A    Number of Children: 6  . Years of Education: N/A   Occupational History  . Retired Arts development officer    Social History Main Topics  . Smoking status: Former Smoker    Quit date: 10/11/1996  . Smokeless tobacco: Current User    Types: Chew  . Alcohol Use: 7.2 oz/week    12 Cans of beer per week     Comment: 6-8 beers weekly  . Drug Use: No  . Sexually Active: Yes   Other Topics Concern  . Not on file   Social History Narrative   Regular exercise:  No   Caffeine Use:  Rarely   Works as a Armed forces logistics/support/administrative officer.   Smokeless tobacco   Married   2 biological children   4 step children             Past Surgical History  Procedure Laterality Date  . Tonsillectomy  1980's    Family History  Problem Relation Age of Onset  . Arthritis Father   . Hyperlipidemia Mother   . Diabetes Neg Hx   . Heart attack Neg Hx   . Hypertension Neg Hx   . Sudden death Neg Hx     No Known Allergies  Current Outpatient Prescriptions on File Prior to Visit  Medication Sig Dispense Refill  . levothyroxine (SYNTHROID, LEVOTHROID) 150 MCG tablet Take 150 mcg by mouth daily. One tablet PO 6 days a week, 1/2 tablet PO one day a week.      . naproxen sodium (ALEVE) 220 MG tablet Take 220 mg by mouth daily as needed.        Marland Kitchen omeprazole (PRILOSEC) 40 MG capsule TAKE 1 CAPSULE (40MG ) BY MOUTH DAILY  90 capsule  1  . verapamil (CALAN) 120 MG tablet TAKE 1 TABLET  (120MG  TOTAL) BY MOUTH DAILY  90 tablet  1   No current facility-administered medications on file prior to visit.    BP 118/86  Pulse 66  Temp(Src) 98.2 F (36.8 C) (Oral)  Resp 16  Ht 5\' 11"  (1.803 m)  Wt 216 lb 1.3 oz (98.013 kg)  BMI 30.15 kg/m2  SpO2 98%       Objective:   Physical Exam  Constitutional: He appears well-developed and well-nourished. No distress.  Cardiovascular: Normal rate and regular rhythm.   No murmur heard. Pulmonary/Chest: Effort normal and breath sounds normal. No respiratory distress. He has no wheezes. He has no rales. He exhibits no tenderness.  Skin: Skin is warm and dry.  Psychiatric: He has a normal mood and affect. His behavior is normal. Judgment and thought content normal.          Assessment & Plan:

## 2013-04-18 ENCOUNTER — Telehealth: Payer: Self-pay | Admitting: Family

## 2013-04-18 NOTE — Telephone Encounter (Signed)
Patient notified

## 2013-04-18 NOTE — Telephone Encounter (Signed)
Please call pt and let him know that we need to go back up on his thyroid medication. He should take one tablet by mouth daily of the synthroid .  Repeat TSH in 6 weeks.

## 2013-04-22 ENCOUNTER — Other Ambulatory Visit: Payer: Self-pay | Admitting: *Deleted

## 2013-04-22 MED ORDER — LEVOTHYROXINE SODIUM 150 MCG PO TABS: 150.0000 ug | ORAL_TABLET | Freq: Every day | ORAL | Status: DC

## 2013-04-22 NOTE — Telephone Encounter (Signed)
Rx request to pharmacy/SLS  

## 2013-07-21 ENCOUNTER — Other Ambulatory Visit: Payer: Self-pay

## 2013-08-01 ENCOUNTER — Other Ambulatory Visit: Payer: Self-pay | Admitting: Family

## 2013-08-21 ENCOUNTER — Other Ambulatory Visit: Payer: Self-pay | Admitting: Family

## 2013-09-19 ENCOUNTER — Other Ambulatory Visit: Payer: Self-pay | Admitting: Family

## 2013-09-19 NOTE — Telephone Encounter (Signed)
Rx request to pharmacy/SLS  

## 2013-09-26 ENCOUNTER — Telehealth: Payer: Self-pay | Admitting: Family

## 2013-09-26 ENCOUNTER — Encounter: Payer: Self-pay | Admitting: Family

## 2013-09-26 ENCOUNTER — Ambulatory Visit (INDEPENDENT_AMBULATORY_CARE_PROVIDER_SITE_OTHER): Payer: PRIVATE HEALTH INSURANCE | Admitting: Family

## 2013-09-26 VITALS — BP 110/70 | HR 61 | Temp 97.4°F | Resp 16 | Ht 71.0 in | Wt 216.1 lb

## 2013-09-26 DIAGNOSIS — E039 Hypothyroidism, unspecified: Secondary | ICD-10-CM

## 2013-09-26 DIAGNOSIS — G43909 Migraine, unspecified, not intractable, without status migrainosus: Secondary | ICD-10-CM

## 2013-09-26 DIAGNOSIS — R51 Headache: Secondary | ICD-10-CM

## 2013-09-26 LAB — TSH: TSH: 0.093 u[IU]/mL — ABNORMAL LOW (ref 0.350–4.500)

## 2013-09-26 MED ORDER — SUMATRIPTAN SUCCINATE 50 MG PO TABS: ORAL_TABLET | ORAL | Status: DC

## 2013-09-26 MED ORDER — LEVOTHYROXINE SODIUM 125 MCG PO TABS: 125.0000 ug | ORAL_TABLET | Freq: Every day | ORAL | Status: DC

## 2013-09-26 MED ORDER — KETOROLAC TROMETHAMINE 30 MG/ML IJ SOLN
60.0000 mg | Freq: Once | INTRAMUSCULAR | Status: AC
  Administered 2013-09-26: 60 mg via INTRAMUSCULAR

## 2013-09-26 NOTE — Progress Notes (Signed)
Pre visit review using our clinic review tool, if applicable. No additional management support is needed unless otherwise documented below in the visit note. 

## 2013-09-26 NOTE — Progress Notes (Signed)
Subjective:    Patient ID: Terry Fox, male    DOB: [birth year], 48 y.o.   MRN: [id number]  Headache  Associated symptoms include nausea. Pertinent negatives include no dizziness, fever or vomiting.   Terry Fox is a 48 year old male who presents today with a chief complaint of migraine headache. Patient reports constant headache began yesterday afternoon and is located to the frontal left side, patient reports taking Goody powder and hydrocodone without relief. Patient taking Verapamil to help control migraines but reports occasional breakthrough migraines. Patient report sensitivity to light and started feeling nauseated today at work.   Review of Systems  Constitutional: Negative for fever and chills.  Eyes: Positive for visual disturbance.       Sensitivity to light.  Gastrointestinal: Positive for nausea. Negative for vomiting.  Neurological: Positive for headaches. Negative for dizziness.  Psychiatric/Behavioral: Negative for sleep disturbance.   Past Medical History  Diagnosis Date  . GERD (gastroesophageal reflux disease)   . Thyroid disease     hypothyroidism  . Depression     in his 75's  . Generalized headaches   . Hernia     History   Social History  . Marital Status: Married    Spouse Name: N/A    Number of Children: 6  . Years of Education: N/A   Occupational History  . Retired Company secretary    Social History Main Topics  . Smoking status: Former Smoker    Quit date: 10/11/1996  . Smokeless tobacco: Current User    Types: Chew  . Alcohol Use: 7.2 oz/week    12 Cans of beer per week     Comment: 6-8 beers weekly  . Drug Use: No  . Sexual Activity: Yes   Other Topics Concern  . Not on file   Social History Narrative   Regular exercise:  No   Caffeine Use:  Rarely   Works as a Theatre stage manager.   Smokeless tobacco   Married   2 biological children   4 step children             Past Surgical History  Procedure Laterality Date  .  Tonsillectomy  1980's    Family History  Problem Relation Age of Onset  . Arthritis Father   . Hyperlipidemia Mother   . Diabetes Neg Hx   . Heart attack Neg Hx   . Hypertension Neg Hx   . Sudden death Neg Hx     No Known Allergies  Current Outpatient Prescriptions on File Prior to Visit  Medication Sig Dispense Refill  . levothyroxine (SYNTHROID, LEVOTHROID) 150 MCG tablet TAKE 1 TABLET BY MOUTH EVERY DAY  30 tablet  0  . naproxen sodium (ALEVE) 220 MG tablet Take 220 mg by mouth daily as needed.        Marland Kitchen omeprazole (PRILOSEC) 40 MG capsule TAKE 1 CAPSULE BY MOUTH DAILY  90 capsule  3  . verapamil (CALAN) 120 MG tablet Take 1 tablet (120 mg total) by mouth daily.  90 tablet  1   No current facility-administered medications on file prior to visit.    BP 110/70  Pulse 61  Temp(Src) 97.4 F (36.3 C) (Oral)  Resp 16  Ht 5\' 11"  (1.803 m)  Wt 216 lb 1.9 oz (98.031 kg)  BMI 30.16 kg/m2  SpO2 99%       Objective:   Physical Exam  Constitutional: He is oriented to person, place, and time.  HENT:  Head: Normocephalic  and atraumatic.  Eyes: Pupils are equal, round, and reactive to light.  Cardiovascular: Normal rate, regular rhythm and normal heart sounds.   Neurological: He is alert and oriented to person, place, and time. He has normal strength. Coordination and gait normal. GCS eye subscore is 4. GCS verbal subscore is 5. GCS motor subscore is 6.  Skin: Skin is warm and dry.  Psychiatric: He has a normal mood and affect.          Assessment & Plan:

## 2013-09-26 NOTE — Patient Instructions (Signed)
Start Imitrex.  Take one tablet now, may repeat one tablet in two hours if no relief. Max dose 2 tablets in 24 hours. Please report to the lab for TSH level. Follow up in three months.

## 2013-09-26 NOTE — Telephone Encounter (Signed)
Thyroid test shows synthroid level needs to be decreased.  Decrease synthroid from 172mcg to 171mcg. Repeat TSH in 6 weeks, dx hypothyroid.

## 2013-09-26 NOTE — Assessment & Plan Note (Signed)
Deteriorated. Will give trial of imitrex. IM toradol given today in office.

## 2013-09-26 NOTE — Telephone Encounter (Signed)
Notified pt and he voices understanding.  Requests that [facility]ine rx [facility] sent to [facility]. Left message on [place] voicemail to cancel previous Rx from today.

## 2013-10-21 ENCOUNTER — Ambulatory Visit: Payer: PRIVATE HEALTH INSURANCE | Admitting: Family

## 2013-10-25 ENCOUNTER — Other Ambulatory Visit: Payer: Self-pay | Admitting: Family

## 2013-10-25 NOTE — Telephone Encounter (Signed)
Received levothyroxine 142mcg request. Pt was changed to 150mcg on 09/26/13 and new rx was sent.  Left detailed message on pharmacy voicemail to use rx on file for 134mcg and to call if any questions.

## 2013-11-04 ENCOUNTER — Telehealth: Payer: Self-pay | Admitting: *Deleted

## 2013-11-04 MED ORDER — SUMATRIPTAN SUCCINATE 50 MG PO TABS: ORAL_TABLET | ORAL | Status: DC

## 2013-11-04 NOTE — Telephone Encounter (Signed)
Received message from pt requesting refill of sumatriptan be sent to [facility] in [place], [place]. Spoke with pt and confirmed locations of pharmacy. Rx sent sent for 1 month supply, pt aware.

## 2013-11-19 ENCOUNTER — Encounter: Payer: Self-pay | Admitting: Family

## 2013-11-19 ENCOUNTER — Other Ambulatory Visit: Payer: Self-pay | Admitting: Family

## 2013-11-19 LAB — TSH: TSH: 2.279 u[IU]/mL (ref 0.350–4.500)

## 2013-11-19 MED ORDER — LEVOTHYROXINE SODIUM 125 MCG PO TABS: 125.0000 ug | ORAL_TABLET | Freq: Every day | ORAL | Status: DC

## 2013-11-23 MED ORDER — LEVOTHYROXINE SODIUM 125 MCG PO TABS: 125.0000 ug | ORAL_TABLET | Freq: Every day | ORAL | Status: DC

## 2013-11-23 NOTE — Telephone Encounter (Signed)
Pt called requesting we send levothyroxine refills to [facility] in [place] and remove [facility] in [place], [place] as that was just a 1 time use while out of town.  Rx re-sent [facility] [place] and pt was made aware.

## 2013-11-28 ENCOUNTER — Encounter: Payer: Self-pay | Admitting: *Deleted

## 2013-12-21 HISTORY — PX: HERNIA REPAIR: SHX51

## 2013-12-30 ENCOUNTER — Ambulatory Visit: Payer: PRIVATE HEALTH INSURANCE | Admitting: Family

## 2014-01-10 ENCOUNTER — Ambulatory Visit: Payer: PRIVATE HEALTH INSURANCE | Admitting: Family

## 2014-01-13 ENCOUNTER — Encounter: Payer: Self-pay | Admitting: Family

## 2014-01-13 ENCOUNTER — Ambulatory Visit (INDEPENDENT_AMBULATORY_CARE_PROVIDER_SITE_OTHER): Payer: PRIVATE HEALTH INSURANCE | Admitting: Family

## 2014-01-13 VITALS — BP 124/80 | HR 63 | Temp 98.3°F | Resp 16 | Ht 71.0 in | Wt 212.0 lb

## 2014-01-13 DIAGNOSIS — K429 Umbilical hernia without obstruction or gangrene: Secondary | ICD-10-CM

## 2014-01-13 DIAGNOSIS — E039 Hypothyroidism, unspecified: Secondary | ICD-10-CM

## 2014-01-13 DIAGNOSIS — G43909 Migraine, unspecified, not intractable, without status migrainosus: Secondary | ICD-10-CM

## 2014-01-13 DIAGNOSIS — K219 Gastro-esophageal reflux disease without esophagitis: Secondary | ICD-10-CM

## 2014-01-13 MED ORDER — SUMATRIPTAN SUCCINATE 50 MG PO TABS
ORAL_TABLET | ORAL | Status: DC
Start: 2014-01-13 — End: 2014-05-23

## 2014-01-13 NOTE — Assessment & Plan Note (Signed)
S/p hernia repair. I think that the firm mass above umbilicus  Is either hematoma or scar tissue. I have advised the pt to call if he develops pain, swelling of this area or if it does not subside in size over the next month or so.  He is scheduled to meet with his surgeon for follow up. His last visit was rescheduled by the surgeon.

## 2014-01-13 NOTE — Patient Instructions (Signed)
Follow up in 6 months, sooner if problems/concerns.  

## 2014-01-13 NOTE — Assessment & Plan Note (Signed)
Stable on prilosec

## 2014-01-13 NOTE — Progress Notes (Signed)
Pre visit review using our clinic review tool, if applicable. No additional management support is needed unless otherwise documented below in the visit note. 

## 2014-01-13 NOTE — Assessment & Plan Note (Signed)
Stable off of diltiazem.  Continue prn imitrex.

## 2014-01-13 NOTE — Progress Notes (Signed)
Subjective:    Patient ID: Terry Fox, male    DOB: May 28, 1966, 48 y.o.   MRN: [id number]  HPI  Terry Fox is a 48 yr old male who presents today for follow up.  1) Hypothyroid-  Last TSH was normal at 2.279.  He is maintained on levothyroxine 159mcg.  2) Umbilical hernia- had hernia repaired on 12/21/13.  3)  Migraine- stopped verapamil.  Wasn't helping.  Last headache 1-2 weeks ago.    4) GERD- stable on prilosec.   Review of Systems See HPI  Past Medical History  Diagnosis Date  . GERD (gastroesophageal reflux disease)   . Thyroid disease     hypothyroidism  . Depression     in his 26's  . Generalized headaches   . Hernia     History   Social History  . Marital Status: Married    Spouse Name: N/A    Number of Children: 6  . Years of Education: N/A   Occupational History  . Retired Company secretary    Social History Main Topics  . Smoking status: Former Smoker    Quit date: 10/11/1996  . Smokeless tobacco: Current User    Types: Chew  . Alcohol Use: 7.2 oz/week    12 Cans of beer per week     Comment: 6-8 beers weekly  . Drug Use: No  . Sexual Activity: Yes   Other Topics Concern  . Not on file   Social History Narrative   Regular exercise:  No   Caffeine Use:  Rarely   Works as a Theatre stage manager.   Smokeless tobacco   Married   2 biological children   4 step children             Past Surgical History  Procedure Laterality Date  . Tonsillectomy  1980's  . Hernia repair  03/22/66    umbilical hernia repair-- [facility]     Family History  Problem Relation Age of Onset  . Arthritis Father   . Hyperlipidemia Mother   . Diabetes Neg Hx   . Heart attack Neg Hx   . Hypertension Neg Hx   . Sudden death Neg Hx     No Known Allergies  Current Outpatient Prescriptions on File Prior to Visit  Medication Sig Dispense Refill  . levothyroxine (SYNTHROID, LEVOTHROID) 125 MCG tablet Take 1 tablet (125 mcg total) by mouth daily.  30 tablet  4  . naproxen  sodium (ALEVE) 220 MG tablet Take 220 mg by mouth daily as needed.        Marland Kitchen omeprazole (PRILOSEC) 40 MG capsule TAKE 1 CAPSULE BY MOUTH DAILY  90 capsule  3  . SUMAtriptan (IMITREX) 50 MG tablet One tablet at the start of migraine. May repeat once 2 hours later if headache persists. Max 2 tabs in 24 hrs  10 tablet  0   No current facility-administered medications on file prior to visit.    BP 124/80  Pulse 63  Temp(Src) 98.3 F (36.8 C) (Oral)  Resp 16  Ht 5\' 11"  (1.803 m)  Wt 212 lb 0.6 oz (96.181 kg)  BMI 29.59 kg/m2  SpO2 99%       Objective:   Physical Exam  Constitutional: He is oriented to person, place, and time. He appears well-developed and well-nourished. No distress.  HENT:  Head: Normocephalic and atraumatic.  Cardiovascular: Normal rate and regular rhythm.   No murmur heard. Pulmonary/Chest: Effort normal and breath sounds normal. No respiratory distress.  He has no wheezes. He has no rales. He exhibits no tenderness.  Abdominal: Soft. Bowel sounds are normal.  Firm marble sized knot just above umbilicus.  Non-tender, non-reducible  Musculoskeletal: He exhibits no edema.  Neurological: He is alert and oriented to person, place, and time.  Psychiatric: He has a normal mood and affect. His behavior is normal. Judgment and thought content normal.          Assessment & Plan:

## 2014-01-13 NOTE — Assessment & Plan Note (Signed)
Stable on synthroid.  Continue current dose.

## 2014-02-14 ENCOUNTER — Telehealth: Payer: Self-pay | Admitting: *Deleted

## 2014-02-14 MED ORDER — OMEPRAZOLE 40 MG PO CPDR: DELAYED_RELEASE_CAPSULE | ORAL | Status: DC

## 2014-02-14 NOTE — Telephone Encounter (Signed)
Pt left message requesting 30 day supply [facility] [place] while he waits on mail order rx. Refill sent, notified pt.

## 2014-03-13 ENCOUNTER — Other Ambulatory Visit: Payer: Self-pay | Admitting: Family

## 2014-05-23 ENCOUNTER — Telehealth: Payer: Self-pay | Admitting: Family

## 2014-05-23 MED ORDER — SUMATRIPTAN SUCCINATE 50 MG PO TABS: ORAL_TABLET | ORAL | Status: DC

## 2014-05-23 NOTE — Telephone Encounter (Signed)
Caller name: Yazan  Relation to pt: self  Call back number: [phone] Pharmacy:[facility] [phone]   Reason for call:   pt requesting a refill SUMAtriptan (IMITREX) 50 MG tablet

## 2014-05-23 NOTE — Telephone Encounter (Signed)
Refill has been sent. Message left on home #.

## 2014-07-21 ENCOUNTER — Encounter: Payer: Self-pay | Admitting: Family

## 2014-07-21 ENCOUNTER — Ambulatory Visit (INDEPENDENT_AMBULATORY_CARE_PROVIDER_SITE_OTHER): Payer: PRIVATE HEALTH INSURANCE | Admitting: Family

## 2014-07-21 VITALS — BP 120/80 | HR 70 | Temp 98.3°F | Resp 16 | Ht 71.0 in | Wt 216.8 lb

## 2014-07-21 DIAGNOSIS — Z23 Encounter for immunization: Secondary | ICD-10-CM | POA: Diagnosis not present

## 2014-07-21 DIAGNOSIS — K219 Gastro-esophageal reflux disease without esophagitis: Secondary | ICD-10-CM | POA: Diagnosis not present

## 2014-07-21 DIAGNOSIS — E039 Hypothyroidism, unspecified: Secondary | ICD-10-CM

## 2014-07-21 LAB — TSH: TSH: 0.16 u[IU]/mL — ABNORMAL LOW (ref 0.35–4.50)

## 2014-07-21 NOTE — Assessment & Plan Note (Signed)
Stable on PPI 

## 2014-07-21 NOTE — Progress Notes (Signed)
Subjective:    Patient ID: Terry Fox, male    DOB: Jul 15, 1966, 48 y.o.   MRN: [id number]  HPI  Terry Fox is a 48 yr old male who presents today for follow up.  1) Hypothyroid- reports that he lost his bottle of 125 mcg, for 6 weeks he has been taking 168mcg tabs. Weight has been stable.   Wt Readings from Last 3 Encounters:  07/21/14 216 lb 12.8 oz (98.34 kg)  01/13/14 212 lb 0.6 oz (96.181 kg)  09/26/13 216 lb 1.9 oz (98.031 kg)   2) Immunizations- requesting tetanus booster. Flu shot up to date.   3) GERD- reports gerd symptoms well controlled on prilosec.    Review of Systems    see HPI  Past Medical History  Diagnosis Date  . GERD (gastroesophageal reflux disease)   . Thyroid disease     hypothyroidism  . Depression     in his 72's  . Generalized headaches   . Hernia     History   Social History  . Marital Status: Married    Spouse Name: N/A    Number of Children: 6  . Years of Education: N/A   Occupational History  . Retired Company secretary    Social History Main Topics  . Smoking status: Former Smoker    Quit date: 10/11/1996  . Smokeless tobacco: Current User    Types: Chew     Comment: Pt currenty vapes  . Alcohol Use: 7.2 oz/week    12 Cans of beer per week     Comment: 6-8 beers weekly  . Drug Use: No  . Sexual Activity: Yes   Other Topics Concern  . Not on file   Social History Narrative   Regular exercise:  No   Caffeine Use:  Rarely   Works as a Theatre stage manager.   Smokeless tobacco   Married   2 biological children   4 step children             Past Surgical History  Procedure Laterality Date  . Tonsillectomy  1980's  . Hernia repair  03/16/52    umbilical hernia repair-- [facility]     Family History  Problem Relation Age of Onset  . Arthritis Father   . Hyperlipidemia Mother   . Diabetes Neg Hx   . Heart attack Neg Hx   . Hypertension Neg Hx   . Sudden death Neg Hx     No Known Allergies  Current Outpatient  Prescriptions on File Prior to Visit  Medication Sig Dispense Refill  . levothyroxine (SYNTHROID, LEVOTHROID) 125 MCG tablet TAKE 1 TABLET BY MOUTH DAILY 90 tablet 1  . naproxen sodium (ALEVE) 220 MG tablet Take 220 mg by mouth daily as needed.      Marland Kitchen omeprazole (PRILOSEC) 40 MG capsule TAKE 1 CAPSULE BY MOUTH DAILY 30 capsule 0  . SUMAtriptan (IMITREX) 50 MG tablet One tablet at the start of migraine. May repeat once 2 hours later if headache persists. Max 2 tabs in 24 hrs 10 tablet 2   No current facility-administered medications on file prior to visit.    BP 120/80 mmHg  Pulse 70  Temp(Src) 98.3 F (36.8 C) (Oral)  Resp 16  Ht 5\' 11"  (1.803 m)  Wt 216 lb 12.8 oz (98.34 kg)  BMI 30.25 kg/m2  SpO2 97%    Objective:   Physical Exam  Constitutional: He is oriented to person, place, and time. He appears well-developed and well-nourished. No  distress.  HENT:  Head: Normocephalic and atraumatic.  Cardiovascular: Normal rate and regular rhythm.   No murmur heard. Pulmonary/Chest: Effort normal and breath sounds normal. No respiratory distress. He has no wheezes. He has no rales. He exhibits no tenderness.  Musculoskeletal: He exhibits no edema.  Neurological: He is alert and oriented to person, place, and time.  Psychiatric: He has a normal mood and affect. His behavior is normal. Thought content normal.          Assessment & Plan:

## 2014-07-21 NOTE — Assessment & Plan Note (Signed)
Clinically stable, continue synthroid, obtain TSH, adjust as needed.

## 2014-07-21 NOTE — Progress Notes (Signed)
Pre visit review using our clinic review tool, if applicable. No additional management support is needed unless otherwise documented below in the visit note. 

## 2014-07-21 NOTE — Patient Instructions (Addendum)
Please complete lab work prior to leaving.  Schedule a fasting physical for some time in the next few months.

## 2014-07-24 ENCOUNTER — Telehealth: Payer: Self-pay | Admitting: Family

## 2014-07-24 DIAGNOSIS — E039 Hypothyroidism, unspecified: Secondary | ICD-10-CM

## 2014-07-24 MED ORDER — LEVOTHYROXINE SODIUM 125 MCG PO TABS: ORAL_TABLET | ORAL | Status: DC

## 2014-07-24 NOTE — Telephone Encounter (Signed)
Labs show thyroid medication needs to be decrease back to the 125 mcg. Rx sent, repeat TSH in 6 weeks,dx hypothyroid.

## 2014-07-25 NOTE — Telephone Encounter (Signed)
Notified pt and he voices understanding. Pt will return 09/05/14 at 4pm for repeat TSH. Future order entered.

## 2014-09-05 ENCOUNTER — Encounter: Payer: Self-pay | Admitting: Family

## 2014-09-05 ENCOUNTER — Other Ambulatory Visit (INDEPENDENT_AMBULATORY_CARE_PROVIDER_SITE_OTHER): Payer: PRIVATE HEALTH INSURANCE

## 2014-09-05 DIAGNOSIS — R945 Abnormal results of liver function studies: Secondary | ICD-10-CM

## 2014-09-05 DIAGNOSIS — E039 Hypothyroidism, unspecified: Secondary | ICD-10-CM

## 2014-09-05 DIAGNOSIS — R7989 Other specified abnormal findings of blood chemistry: Secondary | ICD-10-CM

## 2014-09-05 LAB — TSH: TSH: 3.62 u[IU]/mL (ref 0.35–4.50)

## 2014-09-12 ENCOUNTER — Other Ambulatory Visit: Payer: Self-pay | Admitting: Family

## 2014-09-20 ENCOUNTER — Encounter: Payer: Self-pay | Admitting: Family

## 2014-09-20 ENCOUNTER — Ambulatory Visit (INDEPENDENT_AMBULATORY_CARE_PROVIDER_SITE_OTHER): Payer: PRIVATE HEALTH INSURANCE | Admitting: Family

## 2014-09-20 VITALS — BP 128/80 | HR 66 | Temp 97.9°F | Resp 16 | Ht 72.0 in | Wt 213.6 lb

## 2014-09-20 DIAGNOSIS — M5010 Cervical disc disorder with radiculopathy, unspecified cervical region: Secondary | ICD-10-CM

## 2014-09-20 DIAGNOSIS — G43909 Migraine, unspecified, not intractable, without status migrainosus: Secondary | ICD-10-CM

## 2014-09-20 DIAGNOSIS — E039 Hypothyroidism, unspecified: Secondary | ICD-10-CM

## 2014-09-20 DIAGNOSIS — N529 Male erectile dysfunction, unspecified: Secondary | ICD-10-CM

## 2014-09-20 DIAGNOSIS — K219 Gastro-esophageal reflux disease without esophagitis: Secondary | ICD-10-CM

## 2014-09-20 DIAGNOSIS — Z0000 Encounter for general adult medical examination without abnormal findings: Secondary | ICD-10-CM

## 2014-09-20 LAB — URINALYSIS, ROUTINE W REFLEX MICROSCOPIC
Bilirubin Urine: NEGATIVE
Hgb urine dipstick: NEGATIVE
Ketones, ur: NEGATIVE
Leukocytes, UA: NEGATIVE
Nitrite: NEGATIVE
PH: 6 (ref 5.0–8.0)
RBC / HPF: NONE SEEN (ref 0–?)
SPECIFIC GRAVITY, URINE: 1.015 (ref 1.000–1.030)
Total Protein, Urine: NEGATIVE
URINE GLUCOSE: NEGATIVE
Urobilinogen, UA: 0.2 (ref 0.0–1.0)

## 2014-09-20 LAB — HEPATIC FUNCTION PANEL
ALBUMIN: 4.4 g/dL (ref 3.5–5.2)
ALK PHOS: 103 U/L (ref 39–117)
ALT: 54 U/L — ABNORMAL HIGH (ref 0–53)
AST: 48 U/L — ABNORMAL HIGH (ref 0–37)
BILIRUBIN DIRECT: 0.1 mg/dL (ref 0.0–0.3)
Total Bilirubin: 1.5 mg/dL — ABNORMAL HIGH (ref 0.2–1.2)
Total Protein: 8 g/dL (ref 6.0–8.3)

## 2014-09-20 LAB — CBC WITH DIFFERENTIAL/PLATELET
BASOS PCT: 0.5 % (ref 0.0–3.0)
Basophils Absolute: 0 10*3/uL (ref 0.0–0.1)
EOS ABS: 0.2 10*3/uL (ref 0.0–0.7)
Eosinophils Relative: 1.7 % (ref 0.0–5.0)
HCT: 46.4 % (ref 39.0–52.0)
Hemoglobin: 15.4 g/dL (ref 13.0–17.0)
LYMPHS ABS: 3 10*3/uL (ref 0.7–4.0)
LYMPHS PCT: 32.3 % (ref 12.0–46.0)
MCHC: 33.1 g/dL (ref 30.0–36.0)
MCV: 91 fl (ref 78.0–100.0)
MONO ABS: 0.9 10*3/uL (ref 0.1–1.0)
MONOS PCT: 9.5 % (ref 3.0–12.0)
NEUTROS ABS: 5.2 10*3/uL (ref 1.4–7.7)
Neutrophils Relative %: 56 % (ref 43.0–77.0)
Platelets: 218 10*3/uL (ref 150.0–400.0)
RBC: 5.1 Mil/uL (ref 4.22–5.81)
RDW: 13.2 % (ref 11.5–15.5)
WBC: 9.3 10*3/uL (ref 4.0–10.5)

## 2014-09-20 LAB — BASIC METABOLIC PANEL
BUN: 15 mg/dL (ref 6–23)
CO2: 32 mEq/L (ref 19–32)
Calcium: 9.6 mg/dL (ref 8.4–10.5)
Chloride: 102 mEq/L (ref 96–112)
Creatinine, Ser: 1.3 mg/dL (ref 0.4–1.5)
GFR: 60.39 mL/min (ref >60.00)
GLUCOSE: 98 mg/dL (ref 70–99)
Potassium: 4.8 mEq/L (ref 3.5–5.1)
SODIUM: 138 meq/L (ref 135–145)

## 2014-09-20 LAB — LIPID PANEL
CHOLESTEROL: 227 mg/dL — AB (ref 0–200)
HDL: 26.3 mg/dL — ABNORMAL LOW (ref >39.00)
NonHDL: 200.7
Total CHOL/HDL Ratio: 9
Triglycerides: 285 mg/dL — ABNORMAL HIGH (ref 0.0–149.0)
VLDL: 57 mg/dL — AB (ref 0.0–40.0)

## 2014-09-20 LAB — LDL CHOLESTEROL, DIRECT: Direct LDL: 142.1 mg/dL

## 2014-09-20 MED ORDER — MELOXICAM 7.5 MG PO TABS: 7.5000 mg | ORAL_TABLET | Freq: Every day | ORAL | Status: DC | PRN

## 2014-09-20 MED ORDER — TADALAFIL 10 MG PO TABS: 10.0000 mg | ORAL_TABLET | Freq: Every day | ORAL | Status: DC | PRN

## 2014-09-20 NOTE — Assessment & Plan Note (Signed)
Rx for meloxicam as needed.

## 2014-09-20 NOTE — Progress Notes (Signed)
Pre visit review using our clinic review tool, if applicable. No additional management support is needed unless otherwise documented below in the visit note. 

## 2014-09-20 NOTE — Assessment & Plan Note (Signed)
Controlled well by prilosec. Continue same.

## 2014-09-20 NOTE — Assessment & Plan Note (Signed)
Stab;le on current does of levothyroxine.

## 2014-09-20 NOTE — Assessment & Plan Note (Signed)
Rarely has migraines. Imitrex does aleve pain when he does. Continue same.

## 2014-09-20 NOTE — Assessment & Plan Note (Signed)
Rx for cialis.

## 2014-09-20 NOTE — Progress Notes (Signed)
Subjective:    Patient ID: Terry Fox, male    DOB: 1966-02-07, 49 y.o.   MRN: [id number]  HPI Terry Fox presents today for CPE.  1. Hypothyroidism:  Patient reports positive compliance with synthroid. The patient denies any of the following symptoms: Fatigue, cold intolerance, constipation, weight gain or inability to lose weight.  2. Reports tingling  in left arm that goes down to fingers for the last 3 weeks. Started with pain in shoulder that gathered in elbow and shoots fingers week and a half. Now he only has the tingling. Works on a computer all day. No injury that he knows of. Has desk ergonomically arranged. Has not tried any meds.  Happens often at night and sometimes during day in no particular position. Had some timgling back in summer that went away after a few weeks.  2. GERD: reflux under control with omeprazole.  4. Migraines:  Has been a while since he has had one. Has occasional headaches which are relived by [facility] powders. Migraines are sporadic.  5. Reports having trouble keeping an erection.  Diet: rarely eats fast food. Eats mottly home cooking. Does not follow heart healthy diet.  Exercise: rarely  Immunizations: up to date.  Eye exam: 5 years ago   Lab Results  Component Value Date   TSH 3.62 09/05/2014    Past Medical History  Diagnosis Date  . GERD (gastroesophageal reflux disease)   . Thyroid disease     hypothyroidism  . Depression     in his 60's  . Generalized headaches   . Hernia     History   Social History  . Marital Status: Married    Spouse Name: N/A    Number of Children: 6  . Years of Education: N/A   Occupational History  . Retired Company secretary    Social History Main Topics  . Smoking status: Former Smoker    Quit date: 10/11/1996  . Smokeless tobacco: Current User    Types: Chew     Comment: Pt currenty vapes  . Alcohol Use: 7.2 oz/week    12 Cans of beer per week     Comment: 6-8 beers weekly  . Drug Use: No  .  Sexual Activity: Yes   Other Topics Concern  . Not on file   Social History Narrative   Regular exercise:  No   Caffeine Use:  Rarely   Works as a Theatre stage manager.   Smokeless tobacco   Married   2 biological children   4 step children             Past Surgical History  Procedure Laterality Date  . Tonsillectomy  1980's  . Hernia repair  0/2/63    umbilical hernia repair-- [facility]     Family History  Problem Relation Age of Onset  . Arthritis Father   . Hyperlipidemia Mother   . Diabetes Neg Hx   . Heart attack Neg Hx   . Hypertension Neg Hx   . Sudden death Neg Hx     No Known Allergies  Current Outpatient Prescriptions on File Prior to Visit  Medication Sig Dispense Refill  . levothyroxine (SYNTHROID, LEVOTHROID) 125 MCG tablet TAKE 1 TABLET BY MOUTH DAILY 90 tablet 1  . naproxen sodium (ALEVE) 220 MG tablet Take 220 mg by mouth daily as needed.      Marland Kitchen omeprazole (PRILOSEC) 40 MG capsule TAKE 1 CAPSULE BY MOUTH DAILY 30 capsule 0  . omeprazole (PRILOSEC) 40  MG capsule TAKE 1 CAPSULE BY MOUTH DAILY 90 capsule 1  . SUMAtriptan (IMITREX) 50 MG tablet One tablet at the start of migraine. May repeat once 2 hours later if headache persists. Max 2 tabs in 24 hrs 10 tablet 2   No current facility-administered medications on file prior to visit.    BP 128/80 mmHg  Pulse 66  Temp(Src) 97.9 F (36.6 C) (Oral)  Resp 16  Ht 6' (1.829 m)  Wt 213 lb 9.6 oz (96.888 kg)  BMI 28.96 kg/m2  SpO2 98%  Review of Systems  Constitutional: Negative for fever, chills, fatigue and unexpected weight change.  Respiratory: Negative for cough and shortness of breath.   Cardiovascular: Negative for chest pain, palpitations and leg swelling.  Gastrointestinal: Negative for diarrhea, constipation and blood in stool.  Endocrine: Negative for cold intolerance, polydipsia, polyphagia and polyuria.       Feels that he has low blood sugar at times-feels weak.  Genitourinary: Negative for  difficulty urinating.  Musculoskeletal: Positive for neck pain.  Skin:       Reports no rashes.  Neurological: Negative for dizziness and weakness.  Hematological: Negative for adenopathy.  Psychiatric/Behavioral: Negative for sleep disturbance.       Denies depressions/anxiety.   Past Medical History  Diagnosis Date  . GERD (gastroesophageal reflux disease)   . Thyroid disease     hypothyroidism  . Depression     in his 76's  . Generalized headaches   . Hernia     History   Social History  . Marital Status: Married    Spouse Name: N/A    Number of Children: 6  . Years of Education: N/A   Occupational History  . Retired Company secretary    Social History Main Topics  . Smoking status: Former Smoker    Quit date: 10/11/1996  . Smokeless tobacco: Current User    Types: Chew     Comment: Pt currenty vapes  . Alcohol Use: 7.2 oz/week    12 Cans of beer per week     Comment: 6-8 beers weekly  . Drug Use: No  . Sexual Activity: Yes   Other Topics Concern  . Not on file   Social History Narrative   Regular exercise:  No   Caffeine Use:  Rarely   Works as a Theatre stage manager.   Smokeless tobacco   Married   2 biological children   4 step children             Past Surgical History  Procedure Laterality Date  . Tonsillectomy  1980's  . Hernia repair  01/18/42    umbilical hernia repair-- [facility]     Family History  Problem Relation Age of Onset  . Arthritis Father   . Hyperlipidemia Mother   . Diabetes Neg Hx   . Heart attack Neg Hx   . Hypertension Neg Hx   . Sudden death Neg Hx     No Known Allergies  Current Outpatient Prescriptions on File Prior to Visit  Medication Sig Dispense Refill  . levothyroxine (SYNTHROID, LEVOTHROID) 125 MCG tablet TAKE 1 TABLET BY MOUTH DAILY 90 tablet 1  . omeprazole (PRILOSEC) 40 MG capsule TAKE 1 CAPSULE BY MOUTH DAILY 30 capsule 0  . omeprazole (PRILOSEC) 40 MG capsule TAKE 1 CAPSULE BY MOUTH DAILY 90 capsule 1  . SUMAtriptan  (IMITREX) 50 MG tablet One tablet at the start of migraine. May repeat once 2 hours later if headache persists. Max 2 tabs  in 24 hrs 10 tablet 2   No current facility-administered medications on file prior to visit.    BP 128/80 mmHg  Pulse 66  Temp(Src) 97.9 F (36.6 C) (Oral)  Resp 16  Ht 6' (1.829 m)  Wt 213 lb 9.6 oz (96.888 kg)  BMI 28.96 kg/m2  SpO2 98%       Objective:   Physical Exam Survey: Well-groomed, healthy-appearing. Responds appropriately to questions. Skin/Hair/Nails: Color pink. Skin warm and dry. Nails without clubbing or cyanosis. HEENT: Skull normocephalic/atraumatic. Sclera white, conjunctiva pink. PERLA. Bilateral tympanic membranes intact with good cone of light. Nasal mucosa pink, septum midline, no sinus tenderness.  Throat: Oral mucosa pink, dentition good, pharynx without erythema or exudates. Neck - trachea midline. Neck supple. No thyromegaly. Lymph nodes: No cervical  adenopathy. Thorax and Lungs: Thorax is symmetric with good expansion. Lungs resonant. Breath sounds vesicular without rales, rhonchi, wheezes. Respiration rate regular. No tenderness of ribs/trunk. CV:  No thrills, pulsation, lifts/heaves felt at precordium. S1 and S2 heard. No murmurs or extra sounds.  Extremities without edema.  Dorsalis pedis and posterior tibial pulses are 2+ and symmetric.  Abdomen:  Abdomen is rounded, soft, and non-tender with active bowel sounds. No palpable masses. No hepatosplenomegaly.  Neuro: Mental Status: Alert and cooperative. Oriented to person, place, and time. Thought process coherent. Cognitive processes intact. Cranial nerves: I not tested. II-XII intact with exception of taste which was not tested.  Motor: Good muscle bulk and tone. Strength 5/5 throughout.   Reflexes: 2+ patellar bilaterally.  Assessment & Plan:  Patient seen along with Dawn Whitmire NP-student.  I have personally seen and examined patient and agree with Ms. Whitmire's  assessment and plan.

## 2014-09-20 NOTE — Patient Instructions (Addendum)
Please complete lab work prior to leaving. Start meloxicam once daily for neck/arm pain- do not take any aleve while taking meloxicam. Start cialis as needed.  You may be able to find a coupon online at [email or web address].com Schedule an appointment for a routine eye exam. Try to get regular exercise- goal is 30 minutes 5 days a week. Follow up in 6 months, sooner if problems/concerns.

## 2014-09-21 NOTE — Telephone Encounter (Signed)
Liver testing and cholesterol are elevated.  I would recommend that he complete an ultrasound to further evaluate liver.  Most likely cause is "fatty liver" which can be improved by low fat diet, exercise, weight loss.  Also, i would like him to start atorvastatin 10mg  and repeat flp/lft in 6 weeks.

## 2014-09-22 MED ORDER — ATORVASTATIN CALCIUM 10 MG PO TABS
10.0000 mg | ORAL_TABLET | Freq: Every day | ORAL | Status: DC
Start: 2014-09-22 — End: 2015-01-12

## 2014-09-22 NOTE — Telephone Encounter (Signed)
Notified pt, he voices understanding and is agreeable to proceed with u/s. Lab appt scheduled for 11/03/14 at 8:15am.  Lab order entered. Rx sent.

## 2014-09-25 ENCOUNTER — Ambulatory Visit (HOSPITAL_BASED_OUTPATIENT_CLINIC_OR_DEPARTMENT_OTHER): Admission: RE | Admit: 2014-09-25 | Payer: PRIVATE HEALTH INSURANCE | Source: Ambulatory Visit

## 2014-09-25 ENCOUNTER — Other Ambulatory Visit: Payer: Self-pay | Admitting: Family

## 2014-09-25 ENCOUNTER — Inpatient Hospital Stay (HOSPITAL_BASED_OUTPATIENT_CLINIC_OR_DEPARTMENT_OTHER): Admission: RE | Admit: 2014-09-25 | Payer: PRIVATE HEALTH INSURANCE | Source: Ambulatory Visit

## 2014-09-25 ENCOUNTER — Ambulatory Visit (HOSPITAL_BASED_OUTPATIENT_CLINIC_OR_DEPARTMENT_OTHER)
Admission: RE | Admit: 2014-09-25 | Discharge: 2014-09-25 | Disposition: A | Discharge status: Home / Self Care | Payer: PRIVATE HEALTH INSURANCE | Source: Ambulatory Visit | Attending: Family | Admitting: Family

## 2014-09-25 DIAGNOSIS — R945 Abnormal results of liver function studies: Secondary | ICD-10-CM | POA: Diagnosis not present

## 2014-09-25 DIAGNOSIS — R7989 Other specified abnormal findings of blood chemistry: Secondary | ICD-10-CM

## 2014-09-26 ENCOUNTER — Encounter: Payer: Self-pay | Admitting: Family

## 2014-09-26 ENCOUNTER — Telehealth: Payer: Self-pay | Admitting: *Deleted

## 2014-09-26 DIAGNOSIS — K760 Fatty (change of) liver, not elsewhere classified: Secondary | ICD-10-CM | POA: Insufficient documentation

## 2014-09-26 MED ORDER — MELOXICAM 7.5 MG PO TABS: 7.5000 mg | ORAL_TABLET | Freq: Every day | ORAL | Status: DC | PRN

## 2014-09-26 NOTE — Telephone Encounter (Signed)
Received fax from [facility] [place] to get 90 day supply of Meloxicam rx from 09/20/14?  Also needs collaborating physician name. Call [phone] with response.  Please advise.

## 2014-09-26 NOTE — Telephone Encounter (Signed)
Ok to send 90 day supply of meloxicam no refills.

## 2014-09-26 NOTE — Telephone Encounter (Signed)
Refill sent.

## 2014-10-24 ENCOUNTER — Telehealth: Payer: Self-pay | Admitting: Family

## 2014-10-24 MED ORDER — LEVOTHYROXINE SODIUM 125 MCG PO TABS: ORAL_TABLET | ORAL | Status: DC

## 2014-10-24 NOTE — Telephone Encounter (Signed)
Caller name: Terry, Fox Relation to pt: self  Call back number: [phone]   Reason for call:  Pt requesting a 30 day supply of levothyroxine (SYNTHROID, LEVOTHROID) 125 MCG tablet and a 90 day supply to mail order  [place], [place] [phone] (Phone) [phone] (Fax)

## 2014-10-24 NOTE — Telephone Encounter (Signed)
Refill sent. Notified pt. 

## 2014-11-03 ENCOUNTER — Other Ambulatory Visit (INDEPENDENT_AMBULATORY_CARE_PROVIDER_SITE_OTHER): Payer: PRIVATE HEALTH INSURANCE

## 2014-11-03 ENCOUNTER — Encounter: Payer: Self-pay | Admitting: Family

## 2014-11-03 DIAGNOSIS — R7989 Other specified abnormal findings of blood chemistry: Secondary | ICD-10-CM

## 2014-11-03 DIAGNOSIS — R945 Abnormal results of liver function studies: Secondary | ICD-10-CM

## 2014-11-03 LAB — LIPID PANEL
Cholesterol: 144 mg/dL (ref 0–200)
HDL: 34.3 mg/dL — AB (ref >39.00)
LDL Cholesterol: 75 mg/dL (ref 0–99)
NonHDL: 109.7
Total CHOL/HDL Ratio: 4
Triglycerides: 172 mg/dL — ABNORMAL HIGH (ref 0.0–149.0)
VLDL: 34.4 mg/dL (ref 0.0–40.0)

## 2014-11-03 LAB — HEPATIC FUNCTION PANEL
ALBUMIN: 4.4 g/dL (ref 3.5–5.2)
ALT: 32 U/L (ref 0–53)
AST: 30 U/L (ref 0–37)
Alkaline Phosphatase: 83 U/L (ref 39–117)
Bilirubin, Direct: 0.3 mg/dL (ref 0.0–0.3)
TOTAL PROTEIN: 7.7 g/dL (ref 6.0–8.3)
Total Bilirubin: 1.9 mg/dL — ABNORMAL HIGH (ref 0.2–1.2)

## 2015-01-12 ENCOUNTER — Telehealth: Payer: Self-pay | Admitting: Family

## 2015-01-12 MED ORDER — ATORVASTATIN CALCIUM 10 MG PO TABS: 10.0000 mg | ORAL_TABLET | Freq: Every day | ORAL | Status: DC

## 2015-01-12 NOTE — Telephone Encounter (Signed)
Medication sent to mail order 

## 2015-01-12 NOTE — Telephone Encounter (Signed)
Relation to pt: self  Call back number: [phone] Pharmacy: [facility], [place] [phone] (Phone) [phone] (Fax)         Reason for call:  Pt requesting a refill atorvastatin (LIPITOR) 10 MG tablet

## 2015-03-22 ENCOUNTER — Ambulatory Visit (INDEPENDENT_AMBULATORY_CARE_PROVIDER_SITE_OTHER): Payer: No Typology Code available for payment source | Admitting: Family

## 2015-03-22 ENCOUNTER — Encounter: Payer: Self-pay | Admitting: Family

## 2015-03-22 VITALS — BP 110/78 | HR 65 | Temp 97.7°F | Resp 16 | Ht 72.0 in | Wt 212.4 lb

## 2015-03-22 DIAGNOSIS — G43909 Migraine, unspecified, not intractable, without status migrainosus: Secondary | ICD-10-CM

## 2015-03-22 DIAGNOSIS — R7989 Other specified abnormal findings of blood chemistry: Secondary | ICD-10-CM | POA: Diagnosis not present

## 2015-03-22 DIAGNOSIS — R748 Abnormal levels of other serum enzymes: Secondary | ICD-10-CM

## 2015-03-22 DIAGNOSIS — E039 Hypothyroidism, unspecified: Secondary | ICD-10-CM | POA: Diagnosis not present

## 2015-03-22 DIAGNOSIS — E785 Hyperlipidemia, unspecified: Secondary | ICD-10-CM

## 2015-03-22 DIAGNOSIS — N529 Male erectile dysfunction, unspecified: Secondary | ICD-10-CM

## 2015-03-22 DIAGNOSIS — R945 Abnormal results of liver function studies: Secondary | ICD-10-CM

## 2015-03-22 DIAGNOSIS — K219 Gastro-esophageal reflux disease without esophagitis: Secondary | ICD-10-CM

## 2015-03-22 MED ORDER — SUMATRIPTAN SUCCINATE 50 MG PO TABS: ORAL_TABLET | ORAL | Status: DC

## 2015-03-22 MED ORDER — OMEPRAZOLE 40 MG PO CPDR: DELAYED_RELEASE_CAPSULE | ORAL | Status: DC

## 2015-03-22 MED ORDER — LEVOTHYROXINE SODIUM 125 MCG PO TABS: ORAL_TABLET | ORAL | Status: DC

## 2015-03-22 NOTE — Assessment & Plan Note (Signed)
Obtain follow up LFT's, likely related to fatty liver. Reinforced importance of diet/exercise, weight loss.

## 2015-03-22 NOTE — Progress Notes (Signed)
Subjective:    Patient ID: Terry Fox, male    DOB: 1966-03-09, 49 y.o.   MRN: [id number]  HPI  Terry Fox is a 49 yr old male who presents today for follow up.  Hypothyroid- reports + med compliance. Feels well on current dose.   Lab Results  Component Value Date   TSH 3.62 09/05/2014   ED could not afford cialis reports he does not wish to pursue other ED treatments.   Fatty liver- noted on [place] 1/16.  He is maintained on lipitor.  Last lipid panel noted elevated trigs. Notes that he has not really changed his diet much.   Lab Results  Component Value Date   CHOL 144 11/03/2014   HDL 34.30* 11/03/2014   LDLCALC 75 11/03/2014   LDLDIRECT 142.1 09/20/2014   TRIG 172.0* 11/03/2014   CHOLHDL 4 11/03/2014   GERD- stable on prilosec, no concerns- has symptoms if he stops.   Migraines- reports that he took imitrex the other night with good relief of his symptoms. At most has migraines 2x a month.   Review of Systems See HPI  Past Medical History  Diagnosis Date  . GERD (gastroesophageal reflux disease)   . Thyroid disease     hypothyroidism  . Depression     in his 12's  . Generalized headaches   . Hernia     History   Social History  . Marital Status: Married    Spouse Name: N/A  . Number of Children: 6  . Years of Education: N/A   Occupational History  . Retired Company secretary    Social History Main Topics  . Smoking status: Former Smoker    Quit date: 10/11/1996  . Smokeless tobacco: Current User    Types: Chew     Comment: Pt currenty vapes  . Alcohol Use: 7.2 oz/week    12 Cans of beer per week     Comment: 6-8 beers weekly  . Drug Use: No  . Sexual Activity: Yes   Other Topics Concern  . Not on file   Social History Narrative   Regular exercise:  No   Caffeine Use:  Rarely   Works as a Theatre stage manager.   Smokeless tobacco   Married   2 biological children   4 step children             Past Surgical History  Procedure Laterality Date    . Tonsillectomy  1980's  . Hernia repair  0/6/26    umbilical hernia repair-- [facility]     Family History  Problem Relation Age of Onset  . Arthritis Father   . Hyperlipidemia Mother   . Diabetes Neg Hx   . Heart attack Neg Hx   . Hypertension Neg Hx   . Sudden death Neg Hx     No Known Allergies  Current Outpatient Prescriptions on File Prior to Visit  Medication Sig Dispense Refill  . atorvastatin (LIPITOR) 10 MG tablet Take 1 tablet (10 mg total) by mouth daily. 90 tablet 1  . levothyroxine (SYNTHROID, LEVOTHROID) 125 MCG tablet TAKE 1 TABLET BY MOUTH DAILY 90 tablet 1  . meloxicam (MOBIC) 7.5 MG tablet Take 1 tablet (7.5 mg total) by mouth daily as needed for pain. 90 tablet 0  . omeprazole (PRILOSEC) 40 MG capsule TAKE 1 CAPSULE BY MOUTH DAILY 30 capsule 0  . SUMAtriptan (IMITREX) 50 MG tablet One tablet at the start of migraine. May repeat once 2 hours later if headache  persists. Max 2 tabs in 24 hrs 10 tablet 2   No current facility-administered medications on file prior to visit.    BP 110/78 mmHg  Pulse 65  Temp(Src) 97.7 F (36.5 C) (Oral)  Resp 16  Ht 6' (1.829 m)  Wt 212 lb 6.4 oz (96.344 kg)  BMI 28.80 kg/m2  SpO2 98%       Objective:   Physical Exam  Constitutional: He is oriented to person, place, and time. He appears well-developed and well-nourished. No distress.  HENT:  Head: Normocephalic and atraumatic.  Neck: No thyromegaly present.  Cardiovascular: Normal rate and regular rhythm.   No murmur heard. Pulmonary/Chest: Effort normal and breath sounds normal. No respiratory distress. He has no wheezes. He has no rales.  Musculoskeletal: He exhibits no edema.  Lymphadenopathy:    He has no cervical adenopathy.  Neurological: He is alert and oriented to person, place, and time.  Skin: Skin is warm and dry.  Psychiatric: He has a normal mood and affect. His behavior is normal. Thought content normal.          Assessment & Plan:

## 2015-03-22 NOTE — Assessment & Plan Note (Signed)
Stable on PPI, cannot tolerate being off of med. Continue prilosec.

## 2015-03-22 NOTE — Progress Notes (Signed)
Pre visit review using our clinic review tool, if applicable. No additional management support is needed unless otherwise documented below in the visit note. 

## 2015-03-22 NOTE — Patient Instructions (Addendum)
Please schedule lab visit at the front desk. Come fasting to this appointment.  Work on low fat/low cholesterol diet, exercise, weight loss.  Follow up in 6 months for complete physical.

## 2015-03-22 NOTE — Assessment & Plan Note (Signed)
Well controlled with prn use of imitrex.

## 2015-03-22 NOTE — Assessment & Plan Note (Signed)
Clinically stable on synthroid. Obtain tsh.  Continue synthroid.

## 2015-03-22 NOTE — Assessment & Plan Note (Signed)
cialis too expensive, declines rx at this time.

## 2015-03-23 ENCOUNTER — Ambulatory Visit: Payer: PRIVATE HEALTH INSURANCE | Admitting: Family

## 2015-03-23 ENCOUNTER — Telehealth: Payer: Self-pay | Admitting: *Deleted

## 2015-03-23 NOTE — Telephone Encounter (Signed)
[facility] would like to know if OK to dispense 90-day supply of Imitrex.    (it was refilled 03/22/15 for 10 tablets with 2 refills)   Please advise.

## 2015-03-26 ENCOUNTER — Other Ambulatory Visit (INDEPENDENT_AMBULATORY_CARE_PROVIDER_SITE_OTHER): Payer: No Typology Code available for payment source

## 2015-03-26 ENCOUNTER — Telehealth: Payer: Self-pay | Admitting: Family

## 2015-03-26 DIAGNOSIS — E785 Hyperlipidemia, unspecified: Secondary | ICD-10-CM | POA: Diagnosis not present

## 2015-03-26 DIAGNOSIS — R7989 Other specified abnormal findings of blood chemistry: Secondary | ICD-10-CM | POA: Diagnosis not present

## 2015-03-26 DIAGNOSIS — E039 Hypothyroidism, unspecified: Secondary | ICD-10-CM | POA: Diagnosis not present

## 2015-03-26 DIAGNOSIS — R945 Abnormal results of liver function studies: Secondary | ICD-10-CM

## 2015-03-26 LAB — LIPID PANEL
Cholesterol: 149 mg/dL (ref 0–200)
HDL: 33.2 mg/dL — AB (ref >39.00)
LDL Cholesterol: 81 mg/dL (ref 0–99)
NonHDL: 115.8
Total CHOL/HDL Ratio: 4
Triglycerides: 172 mg/dL — ABNORMAL HIGH (ref 0.0–149.0)
VLDL: 34.4 mg/dL (ref 0.0–40.0)

## 2015-03-26 LAB — HEPATIC FUNCTION PANEL
ALBUMIN: 4.4 g/dL (ref 3.5–5.2)
ALT: 29 U/L (ref 0–53)
AST: 27 U/L (ref 0–37)
Alkaline Phosphatase: 91 U/L (ref 39–117)
BILIRUBIN DIRECT: 0.3 mg/dL (ref 0.0–0.3)
BILIRUBIN TOTAL: 1.6 mg/dL — AB (ref 0.2–1.2)
Total Protein: 7.9 g/dL (ref 6.0–8.3)

## 2015-03-26 LAB — TSH: TSH: 5.13 u[IU]/mL — AB (ref 0.35–4.50)

## 2015-03-26 MED ORDER — LEVOTHYROXINE SODIUM 150 MCG PO TABS: 150.0000 ug | ORAL_TABLET | Freq: Every day | ORAL | Status: DC

## 2015-03-26 MED ORDER — SUMATRIPTAN SUCCINATE 50 MG PO TABS: ORAL_TABLET | ORAL | Status: DC

## 2015-03-26 NOTE — Telephone Encounter (Signed)
Caller name: Gerhard Perches with [facility] Can be reached: [phone] Fax#: [phone]  Reason for call: Imitrex comes in boxes of 9. Suggestion qty of 27 doses for a 3 month supply.

## 2015-03-26 NOTE — Telephone Encounter (Signed)
Labs show that thyroid med needs increase. Increase synthroid from 162mcg to 150 mcg, repeat tsh in 6 weeks, dx hypothyroid.

## 2015-03-26 NOTE — Telephone Encounter (Signed)
Also, triglycerides are mildly elevated. Please work on avoiding concentrated sweets, and limiting white carbs (rice/bread/pasta/potatoes). Instead substitute whole grain versions with reasonable portions. Add fish oil 2000mg  po bid.

## 2015-03-26 NOTE — Addendum Note (Signed)
Addended by: Kelle Darting A on: 03/26/2015 04:18 PM   Modules accepted: Orders

## 2015-03-26 NOTE — Telephone Encounter (Signed)
Rx re-sent for #27 / 90 day supply.

## 2015-03-27 NOTE — Telephone Encounter (Signed)
Notified pt and he voices understanding. Rx sent. Lab appt scheduled 05/08/15 at 3:30pm.  Future lab order entered.

## 2015-03-28 MED ORDER — LEVOTHYROXINE SODIUM 150 MCG PO TABS: 150.0000 ug | ORAL_TABLET | Freq: Every day | ORAL | Status: DC

## 2015-03-28 NOTE — Telephone Encounter (Signed)
Received fax from [facility] that they received Levothyroxine Rx from 03/26/15 and must be 90 day supply.  Faxed response to cancel Rx as it should have gone to [facility]. Rx sent to [facility] until dose is stable.

## 2015-03-28 NOTE — Addendum Note (Signed)
Addended by: Kelle Darting A on: 03/28/2015 02:32 PM   Modules accepted: Orders

## 2015-05-08 ENCOUNTER — Other Ambulatory Visit (INDEPENDENT_AMBULATORY_CARE_PROVIDER_SITE_OTHER): Payer: No Typology Code available for payment source

## 2015-05-08 DIAGNOSIS — E039 Hypothyroidism, unspecified: Secondary | ICD-10-CM

## 2015-05-09 LAB — TSH: TSH: 0.4 u[IU]/mL (ref 0.35–4.50)

## 2015-05-10 ENCOUNTER — Telehealth: Payer: Self-pay | Admitting: Family

## 2015-05-10 DIAGNOSIS — E039 Hypothyroidism, unspecified: Secondary | ICD-10-CM

## 2015-05-10 NOTE — Telephone Encounter (Signed)
Thyroid test is improved.  I would like him to take 1/2 tab of synthroid once weekly full tab all other days. Repeat tsh in 6 weeks.

## 2015-05-10 NOTE — Telephone Encounter (Signed)
Notified pt and he voices understanding.  Lab appt scheduled for 06/25/15 at 3:30pm and future order entered.

## 2015-06-25 ENCOUNTER — Other Ambulatory Visit (INDEPENDENT_AMBULATORY_CARE_PROVIDER_SITE_OTHER): Payer: No Typology Code available for payment source

## 2015-06-25 DIAGNOSIS — E039 Hypothyroidism, unspecified: Secondary | ICD-10-CM

## 2015-06-26 LAB — TSH: TSH: 0.25 u[IU]/mL — ABNORMAL LOW (ref 0.35–4.50)

## 2015-06-27 ENCOUNTER — Telehealth: Payer: Self-pay | Admitting: Family

## 2015-06-27 DIAGNOSIS — E039 Hypothyroidism, unspecified: Secondary | ICD-10-CM

## 2015-06-27 MED ORDER — LEVOTHYROXINE SODIUM 125 MCG PO TABS: 125.0000 ug | ORAL_TABLET | Freq: Every day | ORAL | Status: DC

## 2015-06-27 NOTE — Telephone Encounter (Signed)
Notified patient of the below results. He understood. Lab scheduled for 08/08/15.

## 2015-06-27 NOTE — Telephone Encounter (Signed)
Synthroid dose needs to be decreased. I would recommend that he switch to synthroid 137mcg once daily every day. Repeat TSH in 6 weeks.

## 2015-08-08 ENCOUNTER — Other Ambulatory Visit (INDEPENDENT_AMBULATORY_CARE_PROVIDER_SITE_OTHER): Payer: PRIVATE HEALTH INSURANCE

## 2015-08-08 ENCOUNTER — Encounter: Payer: Self-pay | Admitting: Family

## 2015-08-08 DIAGNOSIS — E039 Hypothyroidism, unspecified: Secondary | ICD-10-CM | POA: Diagnosis not present

## 2015-08-08 LAB — TSH: TSH: 1.47 u[IU]/mL (ref 0.35–4.50)

## 2015-09-21 ENCOUNTER — Telehealth: Payer: Self-pay

## 2015-09-24 ENCOUNTER — Encounter: Payer: PRIVATE HEALTH INSURANCE | Admitting: Family

## 2015-09-26 NOTE — Telephone Encounter (Signed)
Pre-VIsit information

## 2015-10-02 ENCOUNTER — Telehealth: Payer: Self-pay | Admitting: Behavioral Health

## 2015-10-02 ENCOUNTER — Encounter: Payer: Self-pay | Admitting: Behavioral Health

## 2015-10-02 NOTE — Telephone Encounter (Signed)
Pre-Visit Call completed with patient and chart updated.   Pre-Visit Info documented in Specialty Comments under [employer].    

## 2015-10-03 ENCOUNTER — Ambulatory Visit (INDEPENDENT_AMBULATORY_CARE_PROVIDER_SITE_OTHER): Payer: PRIVATE HEALTH INSURANCE | Admitting: Family

## 2015-10-03 ENCOUNTER — Encounter: Payer: Self-pay | Admitting: Family

## 2015-10-03 ENCOUNTER — Telehealth: Payer: Self-pay | Admitting: *Deleted

## 2015-10-03 VITALS — BP 128/88 | HR 65 | Temp 98.0°F | Resp 16 | Ht 72.0 in | Wt 220.2 lb

## 2015-10-03 DIAGNOSIS — Z0001 Encounter for general adult medical examination with abnormal findings: Secondary | ICD-10-CM

## 2015-10-03 DIAGNOSIS — G43909 Migraine, unspecified, not intractable, without status migrainosus: Secondary | ICD-10-CM | POA: Diagnosis not present

## 2015-10-03 DIAGNOSIS — R7989 Other specified abnormal findings of blood chemistry: Secondary | ICD-10-CM

## 2015-10-03 DIAGNOSIS — Z0000 Encounter for general adult medical examination without abnormal findings: Secondary | ICD-10-CM | POA: Diagnosis not present

## 2015-10-03 LAB — URINALYSIS, ROUTINE W REFLEX MICROSCOPIC
BILIRUBIN URINE: NEGATIVE
HGB URINE DIPSTICK: NEGATIVE
Ketones, ur: NEGATIVE
LEUKOCYTES UA: NEGATIVE
NITRITE: NEGATIVE
RBC / HPF: NONE SEEN (ref 0–?)
Specific Gravity, Urine: 1.015 (ref 1.000–1.030)
TOTAL PROTEIN, URINE-UPE24: NEGATIVE
URINE GLUCOSE: NEGATIVE
UROBILINOGEN UA: 0.2 (ref 0.0–1.0)
WBC, UA: NONE SEEN (ref 0–?)
pH: 5.5 (ref 5.0–8.0)

## 2015-10-03 LAB — BASIC METABOLIC PANEL
BUN: 19 mg/dL (ref 6–23)
CALCIUM: 9.3 mg/dL (ref 8.4–10.5)
CO2: 27 meq/L (ref 19–32)
CREATININE: 1.28 mg/dL (ref 0.40–1.50)
Chloride: 105 mEq/L (ref 96–112)
GFR: 63.39 mL/min (ref >60.00)
GLUCOSE: 89 mg/dL (ref 70–99)
Potassium: 4.4 mEq/L (ref 3.5–5.1)
SODIUM: 139 meq/L (ref 135–145)

## 2015-10-03 LAB — LIPID PANEL
Cholesterol: 190 mg/dL (ref 0–200)
HDL: 28.6 mg/dL — AB (ref >39.00)
NONHDL: 161.77
Total CHOL/HDL Ratio: 7
Triglycerides: 306 mg/dL — ABNORMAL HIGH (ref 0.0–149.0)
VLDL: 61.2 mg/dL — AB (ref 0.0–40.0)

## 2015-10-03 LAB — CBC WITH DIFFERENTIAL/PLATELET
BASOS PCT: 0.5 % (ref 0.0–3.0)
Basophils Absolute: 0 10*3/uL (ref 0.0–0.1)
EOS ABS: 0.1 10*3/uL (ref 0.0–0.7)
Eosinophils Relative: 2.2 % (ref 0.0–5.0)
HEMATOCRIT: 39.9 % (ref 39.0–52.0)
Hemoglobin: 13.4 g/dL (ref 13.0–17.0)
LYMPHS ABS: 2.1 10*3/uL (ref 0.7–4.0)
LYMPHS PCT: 32 % (ref 12.0–46.0)
MCHC: 33.7 g/dL (ref 30.0–36.0)
MCV: 89.4 fl (ref 78.0–100.0)
Monocytes Absolute: 0.8 10*3/uL (ref 0.1–1.0)
Monocytes Relative: 12.3 % — ABNORMAL HIGH (ref 3.0–12.0)
NEUTROS ABS: 3.5 10*3/uL (ref 1.4–7.7)
NEUTROS PCT: 53 % (ref 43.0–77.0)
PLATELETS: 187 10*3/uL (ref 150.0–400.0)
RBC: 4.46 Mil/uL (ref 4.22–5.81)
RDW: 13.4 % (ref 11.5–15.5)
WBC: 6.6 10*3/uL (ref 4.0–10.5)

## 2015-10-03 LAB — HEPATIC FUNCTION PANEL
ALBUMIN: 4.1 g/dL (ref 3.5–5.2)
ALT: 21 U/L (ref 0–53)
AST: 19 U/L (ref 0–37)
Alkaline Phosphatase: 89 U/L (ref 39–117)
BILIRUBIN DIRECT: 0.1 mg/dL (ref 0.0–0.3)
TOTAL PROTEIN: 7.2 g/dL (ref 6.0–8.3)
Total Bilirubin: 1 mg/dL (ref 0.2–1.2)

## 2015-10-03 LAB — LDL CHOLESTEROL, DIRECT: LDL DIRECT: 124 mg/dL

## 2015-10-03 MED ORDER — OMEPRAZOLE 40 MG PO CPDR: DELAYED_RELEASE_CAPSULE | ORAL | Status: DC

## 2015-10-03 MED ORDER — VERAPAMIL HCL ER 100 MG PO CP24: 100.0000 mg | ORAL_CAPSULE | Freq: Every day | ORAL | Status: DC

## 2015-10-03 MED ORDER — ATORVASTATIN CALCIUM 10 MG PO TABS: 10.0000 mg | ORAL_TABLET | Freq: Every day | ORAL | Status: DC

## 2015-10-03 MED ORDER — SUMATRIPTAN SUCCINATE 50 MG PO TABS: ORAL_TABLET | ORAL | Status: DC

## 2015-10-03 MED ORDER — LEVOTHYROXINE SODIUM 125 MCG PO TABS: 125.0000 ug | ORAL_TABLET | Freq: Every day | ORAL | Status: DC

## 2015-10-03 NOTE — Telephone Encounter (Signed)
-----   Message from [name], NP sent at 10/03/2015  8:43 AM EST ----- Could you please cancel verapamil at mail order? I resent to local pharmacy.

## 2015-10-03 NOTE — Progress Notes (Addendum)
Subjective:    Patient ID: Terry Fox, male    DOB: 01/11/1966, 50 y.o.   MRN: [id number]  HPI  Patient presents today for complete physical.  Immunizations: tetanus up to date Diet: reports diet is healthy Wt Readings from Last 3 Encounters:  10/03/15 220 lb 3.2 oz (99.882 kg)  03/22/15 212 lb 6.4 oz (96.344 kg)  09/20/14 213 lb 9.6 oz (96.888 kg)  Exercise: not exercising Colonoscopy:will begin at 2 Dental- due Eye exam- up to date  Migraines- reports HA x 1.5 weeks.  Only thing that helps is imitex + 2 goodie powders and HA resolves x 2 hrs then returns. Not quite severity of previous migraines. "just won't go away."    Review of Systems  Constitutional: Positive for unexpected weight change.  HENT: Negative for hearing loss and rhinorrhea.   Eyes: Negative for visual disturbance.  Respiratory: Negative for cough.   Cardiovascular: Negative for chest pain and leg swelling.  Gastrointestinal: Negative for diarrhea, constipation and blood in stool.  Genitourinary: Negative for dysuria and frequency.  Musculoskeletal: Negative for myalgias and arthralgias.  Skin: Negative for rash.  Neurological: Positive for headaches.  Hematological: Negative for adenopathy.  Psychiatric/Behavioral:       Denies depression or anxiety   Past Medical History  Diagnosis Date  . GERD (gastroesophageal reflux disease)   . Thyroid disease     hypothyroidism  . Depression     in his 29's  . Generalized headaches   . Hernia     Social History   Social History  . Marital Status: Married    Spouse Name: N/A  . Number of Children: 6  . Years of Education: N/A   Occupational History  . Retired Company secretary    Social History Main Topics  . Smoking status: Former Smoker    Quit date: 10/11/1996  . Smokeless tobacco: Current User    Types: Chew     Comment: Pt currenty vapes (3mg  nicotine)  . Alcohol Use: 7.2 oz/week    12 Cans of beer per week     Comment: 6-8 beers weekly  .  Drug Use: No  . Sexual Activity: Yes   Other Topics Concern  . Not on file   Social History Narrative   Regular exercise:  No   Caffeine Use:  Rarely   Works as a Theatre stage manager.   Smokeless tobacco   Married   2 biological children   4 step children             Past Surgical History  Procedure Laterality Date  . Tonsillectomy  1980's  . Hernia repair  99991111    umbilical hernia repair-- [facility]     Family History  Problem Relation Age of Onset  . Arthritis Father   . Hyperlipidemia Mother   . Diabetes Neg Hx   . Heart attack Neg Hx   . Hypertension Neg Hx   . Sudden death Neg Hx     No Known Allergies  Current Outpatient Prescriptions on File Prior to Visit  Medication Sig Dispense Refill  . levothyroxine (SYNTHROID, LEVOTHROID) 125 MCG tablet Take 1 tablet (125 mcg total) by mouth daily. 30 tablet 3  . nitroGLYCERIN (NITROSTAT) 0.4 MG SL tablet Place 0.4 mg under the tongue as needed for chest pain.    Marland Kitchen omeprazole (PRILOSEC) 40 MG capsule TAKE 1 CAPSULE BY MOUTH DAILY 90 capsule 1  . SUMAtriptan (IMITREX) 50 MG tablet One tablet at the start of  migraine. May repeat once 2 hours later if headache persists. Max 2 tabs in 24 hrs 27 tablet 0  . atorvastatin (LIPITOR) 10 MG tablet Take 1 tablet (10 mg total) by mouth daily. (Patient not taking: Reported on 10/03/2015) 90 tablet 1   No current facility-administered medications on file prior to visit.    BP 128/88 mmHg  Pulse 65  Temp(Src) 98 F (36.7 C) (Oral)  Resp 16  Ht 6' (1.829 m)  Wt 220 lb 3.2 oz (99.882 kg)  BMI 29.86 kg/m2  SpO2 100%        Objective:   Physical Exam Physical Exam  Constitutional: He is oriented to person, place, and time. He appears well-developed and well-nourished. No distress.  HENT:  Head: Normocephalic and atraumatic.  Right Ear: Tympanic membrane and ear canal normal.  Left Ear: Tympanic membrane and ear canal normal.  Mouth/Throat: Oropharynx is clear and moist.  Eyes:  Pupils are equal, round, and reactive to light. No scleral icterus.  Neck: Normal range of motion. No thyromegaly present.  Cardiovascular: Normal rate and regular rhythm.   No murmur heard. Pulmonary/Chest: Effort normal and breath sounds normal. No respiratory distress. He has no wheezes. He has no rales. He exhibits no tenderness.  Abdominal: Soft. Bowel sounds are normal. He exhibits no distension and no mass. There is no tenderness. There is no rebound and no guarding.  Musculoskeletal: He exhibits no edema.  Lymphadenopathy:    He has no cervical adenopathy.  Neurological: He is alert and oriented to person, place, and time. He has normal patellar reflexes. He exhibits normal muscle tone. Coordination normal.  Skin: Skin is warm and dry.  Psychiatric: He has a normal mood and affect. His behavior is normal. Judgment and thought content normal.          Assessment & Plan:         Assessment & Plan:  EKG tracing is personally reviewed.  EKG notes NSR.  No acute changes.

## 2015-10-03 NOTE — Assessment & Plan Note (Signed)
Deteriorated. In the past HA's were controlled with prophylactic verapamil. Will resume and plan to repeat bp in 1 month. Continue prn imitrex.

## 2015-10-03 NOTE — Patient Instructions (Signed)
Please complete lab work prior to leaving. Restart lipitor. Start diltiazem.

## 2015-10-03 NOTE — Assessment & Plan Note (Signed)
Discussed healthy diet, exercise, weight loss.  Obtain routine lab work.  Immunizations reviewed and up to date.  

## 2015-10-03 NOTE — Addendum Note (Signed)
Addended by: [name] on: 10/03/2015 09:16 AM   Modules accepted: Level of Service, SmartSet

## 2015-10-03 NOTE — Telephone Encounter (Signed)
Cancellation request faxed to [facility].

## 2015-10-03 NOTE — Progress Notes (Signed)
Pre visit review using our clinic review tool, if applicable. No additional management support is needed unless otherwise documented below in the visit note. 

## 2015-10-07 ENCOUNTER — Other Ambulatory Visit: Payer: Self-pay | Admitting: Family

## 2015-10-07 ENCOUNTER — Encounter: Payer: Self-pay | Admitting: Family

## 2015-10-07 MED ORDER — ATORVASTATIN CALCIUM 10 MG PO TABS: 20.0000 mg | ORAL_TABLET | Freq: Every day | ORAL | Status: DC

## 2015-10-29 ENCOUNTER — Ambulatory Visit (INDEPENDENT_AMBULATORY_CARE_PROVIDER_SITE_OTHER): Payer: PRIVATE HEALTH INSURANCE | Admitting: Family

## 2015-10-29 ENCOUNTER — Encounter: Payer: Self-pay | Admitting: Family

## 2015-10-29 VITALS — BP 108/72 | HR 60 | Temp 98.2°F | Resp 16 | Ht 72.0 in | Wt 217.2 lb

## 2015-10-29 DIAGNOSIS — G43909 Migraine, unspecified, not intractable, without status migrainosus: Secondary | ICD-10-CM | POA: Diagnosis not present

## 2015-10-29 MED ORDER — VERAPAMIL HCL ER 100 MG PO CP24: 100.0000 mg | ORAL_CAPSULE | Freq: Every day | ORAL | Status: DC

## 2015-10-29 NOTE — Assessment & Plan Note (Signed)
Improved with addition of verapamil. Advised pt ok to use good powder sparingly.  Pt verbalizes understanding.

## 2015-10-29 NOTE — Patient Instructions (Signed)
- 

## 2015-10-29 NOTE — Progress Notes (Signed)
Pre visit review using our clinic review tool, if applicable. No additional management support is needed unless otherwise documented below in the visit note. 

## 2015-10-29 NOTE — Progress Notes (Signed)
Subjective:    Patient ID: Terry Fox, male    DOB: 03-Jan-1966, 49 y.o.   MRN: [id number]  HPI   Mr. Arrieta is a 50 yr old male who presents today for follow up of migraine. Last visit he noted increased frequency of migraines and he was placed on prophylactic verapamil.  He reports no migraines since he started verapamil.  Reports occasional minor headaches which resolve with goody powder.    Review of Systems  Gastrointestinal: Negative for nausea.   See HPI  Past Medical History  Diagnosis Date  . GERD (gastroesophageal reflux disease)   . Thyroid disease     hypothyroidism  . Depression     in his 84's  . Generalized headaches   . Hernia     Social History   Social History  . Marital Status: Married    Spouse Name: N/A  . Number of Children: 6  . Years of Education: N/A   Occupational History  . Retired Company secretary    Social History Main Topics  . Smoking status: Former Smoker    Quit date: 10/11/1996  . Smokeless tobacco: Current User    Types: Chew     Comment: Pt currenty vapes (3mg  nicotine)  . Alcohol Use: 7.2 oz/week    12 Cans of beer per week     Comment: 6-8 beers weekly  . Drug Use: No  . Sexual Activity: Yes   Other Topics Concern  . Not on file   Social History Narrative   Regular exercise:  No   Caffeine Use:  Rarely   Works as a Theatre stage manager.   Smokeless tobacco   Married   2 biological children   4 step children             Past Surgical History  Procedure Laterality Date  . Tonsillectomy  1980's  . Hernia repair  99991111    umbilical hernia repair-- [facility]     Family History  Problem Relation Age of Onset  . Arthritis Father   . Hyperlipidemia Mother   . Diabetes Neg Hx   . Heart attack Neg Hx   . Hypertension Neg Hx   . Sudden death Neg Hx     No Known Allergies  Current Outpatient Prescriptions on File Prior to Visit  Medication Sig Dispense Refill  . atorvastatin (LIPITOR) 10 MG tablet Take 2 tablets (20 mg  total) by mouth daily. 90 tablet 1  . levothyroxine (SYNTHROID, LEVOTHROID) 125 MCG tablet Take 1 tablet (125 mcg total) by mouth daily. 90 tablet 1  . nitroGLYCERIN (NITROSTAT) 0.4 MG SL tablet Place 0.4 mg under the tongue as needed for chest pain.    Marland Kitchen omeprazole (PRILOSEC) 40 MG capsule TAKE 1 CAPSULE BY MOUTH DAILY 90 capsule 1  . SUMAtriptan (IMITREX) 50 MG tablet One tablet at the start of migraine. May repeat once 2 hours later if headache persists. Max 2 tabs in 24 hrs 27 tablet 1  . verapamil (VERELAN) 100 MG 24 hr capsule Take 1 capsule (100 mg total) by mouth at bedtime. 30 capsule 2   No current facility-administered medications on file prior to visit.    BP 108/72 mmHg  Pulse 60  Temp(Src) 98.2 F (36.8 C) (Oral)  Resp 16  Ht 6' (1.829 m)  Wt 217 lb 3.2 oz (98.521 kg)  BMI 29.45 kg/m2  SpO2 99%       Objective:   Physical Exam  Constitutional: He is oriented to  person, place, and time. He appears well-developed and well-nourished. No distress.  HENT:  Head: Normocephalic and atraumatic.  Eyes: EOM are normal.  Cardiovascular: Normal rate and regular rhythm.   No murmur heard. Pulmonary/Chest: Effort normal and breath sounds normal. No respiratory distress. He has no wheezes. He has no rales.  Musculoskeletal: He exhibits no edema.  Neurological: He is alert and oriented to person, place, and time.  Skin: Skin is warm and dry.  Psychiatric: He has a normal mood and affect. His behavior is normal. Thought content normal.          Assessment & Plan:

## 2015-11-14 DIAGNOSIS — M545 Low back pain, unspecified: Secondary | ICD-10-CM | POA: Insufficient documentation

## 2016-04-01 ENCOUNTER — Other Ambulatory Visit: Payer: Self-pay | Admitting: Family

## 2016-04-25 ENCOUNTER — Ambulatory Visit (HOSPITAL_BASED_OUTPATIENT_CLINIC_OR_DEPARTMENT_OTHER)
Admission: RE | Admit: 2016-04-25 | Discharge: 2016-04-25 | Disposition: A | Discharge status: Home / Self Care | Payer: PRIVATE HEALTH INSURANCE | Source: Ambulatory Visit | Attending: Family | Admitting: Family

## 2016-04-25 ENCOUNTER — Ambulatory Visit (INDEPENDENT_AMBULATORY_CARE_PROVIDER_SITE_OTHER): Payer: PRIVATE HEALTH INSURANCE | Admitting: Family

## 2016-04-25 ENCOUNTER — Encounter: Payer: Self-pay | Admitting: Family

## 2016-04-25 ENCOUNTER — Ambulatory Visit: Payer: PRIVATE HEALTH INSURANCE | Admitting: Family

## 2016-04-25 VITALS — BP 110/78 | HR 90 | Temp 98.3°F | Resp 18 | Ht 72.0 in | Wt 214.2 lb

## 2016-04-25 DIAGNOSIS — M25512 Pain in left shoulder: Secondary | ICD-10-CM

## 2016-04-25 DIAGNOSIS — M542 Cervicalgia: Secondary | ICD-10-CM | POA: Insufficient documentation

## 2016-04-25 DIAGNOSIS — M4802 Spinal stenosis, cervical region: Secondary | ICD-10-CM | POA: Insufficient documentation

## 2016-04-25 DIAGNOSIS — E785 Hyperlipidemia, unspecified: Secondary | ICD-10-CM

## 2016-04-25 DIAGNOSIS — M50322 Other cervical disc degeneration at C5-C6 level: Secondary | ICD-10-CM | POA: Insufficient documentation

## 2016-04-25 DIAGNOSIS — G43909 Migraine, unspecified, not intractable, without status migrainosus: Secondary | ICD-10-CM

## 2016-04-25 DIAGNOSIS — E039 Hypothyroidism, unspecified: Secondary | ICD-10-CM | POA: Diagnosis not present

## 2016-04-25 LAB — LIPID PANEL
Cholesterol: 143 mg/dL (ref 0–200)
HDL: 29.3 mg/dL — AB (ref >39.00)
NONHDL: 113.89
TRIGLYCERIDES: 220 mg/dL — AB (ref 0.0–149.0)
Total CHOL/HDL Ratio: 5
VLDL: 44 mg/dL — ABNORMAL HIGH (ref 0.0–40.0)

## 2016-04-25 LAB — TSH: TSH: 1.85 u[IU]/mL (ref 0.35–4.50)

## 2016-04-25 LAB — LDL CHOLESTEROL, DIRECT: Direct LDL: 92 mg/dL

## 2016-04-25 MED ORDER — MELOXICAM 7.5 MG PO TABS: 7.5000 mg | ORAL_TABLET | Freq: Every day | ORAL | 0 refills | Status: DC

## 2016-04-25 MED ORDER — CYCLOBENZAPRINE HCL 5 MG PO TABS: 5.0000 mg | ORAL_TABLET | Freq: Three times a day (TID) | ORAL | 0 refills | Status: DC | PRN

## 2016-04-25 NOTE — Patient Instructions (Signed)
Please complete x rays on the first floor. Begin Meloxicam (anti-inflammatory) once daily. You may use flexeril 3 times daily as needed (muscle relaxer)- this may cause drowsiness though so do not drive after taking. Call if neck pain or shoulder pain worsens, if you develop numbness or if symptoms are not improved in 1 week.

## 2016-04-25 NOTE — Assessment & Plan Note (Signed)
Obtain follow up tsh, continue synthroid.  
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
## 2016-04-25 NOTE — Assessment & Plan Note (Signed)
Intermittent, fair control. Continue prn imitrex.

## 2016-04-25 NOTE — Progress Notes (Signed)
Pre visit review using our clinic review tool, if applicable. No additional management support is needed unless otherwise documented below in the visit note. 

## 2016-04-26 ENCOUNTER — Encounter: Payer: Self-pay | Admitting: Family

## 2016-04-26 DIAGNOSIS — S4992XA Unspecified injury of left shoulder and upper arm, initial encounter: Secondary | ICD-10-CM

## 2016-04-28 ENCOUNTER — Telehealth: Payer: Self-pay | Admitting: Family

## 2016-04-28 NOTE — Telephone Encounter (Signed)
Caller name:Torry Juleen China Relationship to patient: Can be reached:[phone] Pharmacy:  Reason for call:Requesting xray results of shoulder

## 2016-04-28 NOTE — Telephone Encounter (Signed)
Please see 8/12 mychart result.

## 2016-04-29 ENCOUNTER — Other Ambulatory Visit: Payer: Self-pay | Admitting: Family

## 2016-04-29 NOTE — Telephone Encounter (Signed)
Rx request to pharmacy/SLS  

## 2016-04-30 ENCOUNTER — Ambulatory Visit (INDEPENDENT_AMBULATORY_CARE_PROVIDER_SITE_OTHER): Payer: PRIVATE HEALTH INSURANCE | Admitting: Family Medicine

## 2016-04-30 ENCOUNTER — Encounter: Payer: Self-pay | Admitting: Family Medicine

## 2016-04-30 DIAGNOSIS — S4991XA Unspecified injury of right shoulder and upper arm, initial encounter: Secondary | ICD-10-CM

## 2016-04-30 MED ORDER — NITROGLYCERIN 0.2 MG/HR TD PT24: MEDICATED_PATCH | TRANSDERMAL | 1 refills | Status: DC

## 2016-04-30 NOTE — Patient Instructions (Addendum)
You have a partial rotator cuff tear (of the supraspinatus). Start the home exercises - arm circles, swings, table slides 3 sets of 10 once or twice a day. Over the next 1-2 week add the strengthening exercises with theraband as directed - 3 sets of 10 once a day. Icing 15 minutes at a time 3-4 times a day. Take the meloxicam as prescribed. Cyclobenzaprine if needed for spasms. Nitro patches 1/4th patch over affected shoulder, change daily. Call me if you want to do physical therapy. Otherwise follow up with me in about 1 month for reevaluation.

## 2016-05-05 ENCOUNTER — Other Ambulatory Visit: Payer: Self-pay | Admitting: Family

## 2016-05-05 DIAGNOSIS — S4991XA Unspecified injury of right shoulder and upper arm, initial encounter: Secondary | ICD-10-CM | POA: Insufficient documentation
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
## 2016-05-05 NOTE — Assessment & Plan Note (Signed)
performed and independently reviewed ultrasound - partial supraspinatus tear.  Shown home exercises to regain motion first.  Declined physical therapy for now.  Icing, meloxicam and nitro patches (discussed risks of headache, skin irritation).  Flexeril as needed for spasms.  Call us if he decides to go ahead with physical therapy.  F/u in 1 month - consider repeat ultrasound, PT depending on his progress.

## 2016-05-28 ENCOUNTER — Ambulatory Visit (INDEPENDENT_AMBULATORY_CARE_PROVIDER_SITE_OTHER): Payer: PRIVATE HEALTH INSURANCE | Admitting: Family Medicine

## 2016-05-28 ENCOUNTER — Encounter: Payer: Self-pay | Admitting: Family Medicine

## 2016-05-28 DIAGNOSIS — S4991XD Unspecified injury of right shoulder and upper arm, subsequent encounter: Secondary | ICD-10-CM

## 2016-05-28 MED ORDER — MELOXICAM 7.5 MG PO TABS: 7.5000 mg | ORAL_TABLET | Freq: Every day | ORAL | 1 refills | Status: DC

## 2016-05-28 NOTE — Patient Instructions (Signed)
The partial tear looks the same on ultrasound which is a good thing. Call me if you want to do physical therapy. Continue the home exercises - arm circles, swings, table slides 3 sets of 10 once or twice a day, theraband strengthening as well 3 sets of 10 once a day. Icing 15 minutes at a time 3-4 times a day. Meloxicam only if needed. Follow up with me in 6 weeks or as needed.
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
## 2016-06-02 NOTE — Assessment & Plan Note (Signed)
Partial supraspinatus tear seen on ultrasound - no changes or increased retraction on today's ultrasound.  Discussed options - declined physical therapy for now - continue home exercises.  Icing and meloxicam if needed.  F/u in 6 weeks.

## 2016-07-23 ENCOUNTER — Other Ambulatory Visit: Payer: Self-pay | Admitting: Family Medicine

## 2016-08-05 ENCOUNTER — Other Ambulatory Visit: Payer: Self-pay | Admitting: Family

## 2016-10-05 ENCOUNTER — Other Ambulatory Visit: Payer: Self-pay | Admitting: Family

## 2016-10-31 ENCOUNTER — Ambulatory Visit (INDEPENDENT_AMBULATORY_CARE_PROVIDER_SITE_OTHER): Payer: PRIVATE HEALTH INSURANCE | Admitting: Family

## 2016-10-31 ENCOUNTER — Encounter: Payer: Self-pay | Admitting: Gastroenterology

## 2016-10-31 ENCOUNTER — Encounter: Payer: Self-pay | Admitting: Family

## 2016-10-31 VITALS — BP 118/78 | HR 60 | Temp 98.2°F | Resp 16 | Ht 72.0 in | Wt 213.8 lb

## 2016-10-31 DIAGNOSIS — Z0001 Encounter for general adult medical examination with abnormal findings: Secondary | ICD-10-CM | POA: Diagnosis not present

## 2016-10-31 DIAGNOSIS — M25562 Pain in left knee: Secondary | ICD-10-CM

## 2016-10-31 DIAGNOSIS — M545 Low back pain: Secondary | ICD-10-CM | POA: Diagnosis not present

## 2016-10-31 DIAGNOSIS — M25561 Pain in right knee: Secondary | ICD-10-CM | POA: Diagnosis not present

## 2016-10-31 DIAGNOSIS — Z0000 Encounter for general adult medical examination without abnormal findings: Secondary | ICD-10-CM

## 2016-10-31 LAB — LIPID PANEL
Cholesterol: 131 mg/dL (ref 0–200)
HDL: 27.8 mg/dL — AB (ref >39.00)
LDL Cholesterol: 66 mg/dL (ref 0–99)
NONHDL: 102.97
Total CHOL/HDL Ratio: 5
Triglycerides: 183 mg/dL — ABNORMAL HIGH (ref 0.0–149.0)
VLDL: 36.6 mg/dL (ref 0.0–40.0)

## 2016-10-31 LAB — HEPATIC FUNCTION PANEL
ALBUMIN: 4.3 g/dL (ref 3.5–5.2)
ALK PHOS: 78 U/L (ref 39–117)
ALT: 17 U/L (ref 0–53)
AST: 16 U/L (ref 0–37)
BILIRUBIN TOTAL: 1.7 mg/dL — AB (ref 0.2–1.2)
Bilirubin, Direct: 0.3 mg/dL (ref 0.0–0.3)
TOTAL PROTEIN: 7 g/dL (ref 6.0–8.3)

## 2016-10-31 LAB — URINALYSIS, ROUTINE W REFLEX MICROSCOPIC
Bilirubin Urine: NEGATIVE
Hgb urine dipstick: NEGATIVE
Ketones, ur: NEGATIVE
Leukocytes, UA: NEGATIVE
NITRITE: NEGATIVE
PH: 5.5 (ref 5.0–8.0)
RBC / HPF: NONE SEEN (ref 0–?)
Specific Gravity, Urine: 1.025 (ref 1.000–1.030)
TOTAL PROTEIN, URINE-UPE24: NEGATIVE
Urine Glucose: NEGATIVE
Urobilinogen, UA: 0.2 (ref 0.0–1.0)

## 2016-10-31 LAB — CBC WITH DIFFERENTIAL/PLATELET
BASOS ABS: 0.1 10*3/uL (ref 0.0–0.1)
Basophils Relative: 0.8 % (ref 0.0–3.0)
Eosinophils Absolute: 0.1 10*3/uL (ref 0.0–0.7)
Eosinophils Relative: 1.9 % (ref 0.0–5.0)
HCT: 39.7 % (ref 39.0–52.0)
Hemoglobin: 13.5 g/dL (ref 13.0–17.0)
LYMPHS ABS: 2.3 10*3/uL (ref 0.7–4.0)
LYMPHS PCT: 30.9 % (ref 12.0–46.0)
MCHC: 34.1 g/dL (ref 30.0–36.0)
MCV: 90.1 fl (ref 78.0–100.0)
MONOS PCT: 9.5 % (ref 3.0–12.0)
Monocytes Absolute: 0.7 10*3/uL (ref 0.1–1.0)
Neutro Abs: 4.2 10*3/uL (ref 1.4–7.7)
Neutrophils Relative %: 56.9 % (ref 43.0–77.0)
Platelets: 192 10*3/uL (ref 150.0–400.0)
RBC: 4.4 Mil/uL (ref 4.22–5.81)
RDW: 13.1 % (ref 11.5–15.5)
WBC: 7.4 10*3/uL (ref 4.0–10.5)

## 2016-10-31 LAB — BASIC METABOLIC PANEL
BUN: 17 mg/dL (ref 6–23)
CHLORIDE: 104 meq/L (ref 96–112)
CO2: 29 mEq/L (ref 19–32)
Calcium: 9.3 mg/dL (ref 8.4–10.5)
Creatinine, Ser: 1.18 mg/dL (ref 0.40–1.50)
GFR: 69.33 mL/min (ref >60.00)
Glucose, Bld: 97 mg/dL (ref 70–99)
Potassium: 4.8 mEq/L (ref 3.5–5.1)
Sodium: 138 mEq/L (ref 135–145)

## 2016-10-31 LAB — TSH: TSH: 1.46 u[IU]/mL (ref 0.35–4.50)

## 2016-10-31 LAB — PSA: PSA: 0.4 ng/mL (ref 0.10–4.00)

## 2016-10-31 MED ORDER — LEVOTHYROXINE SODIUM 125 MCG PO TABS: ORAL_TABLET | ORAL | 1 refills | Status: DC

## 2016-10-31 MED ORDER — MELOXICAM 7.5 MG PO TABS: 7.5000 mg | ORAL_TABLET | Freq: Every day | ORAL | 0 refills | Status: DC

## 2016-10-31 NOTE — Patient Instructions (Addendum)
Please complete lab work prior to leaving. Work on [employer], adding regular exercise and weight loss.    Back Exercises The following exercises strengthen the muscles that help to support the back. They also help to keep the lower back flexible. Doing these exercises can help to prevent back pain or lessen existing pain. If you have back pain or discomfort, try doing these exercises 2-3 times each day or as told by your health care provider. When the pain goes away, do them once each day, but increase the number of times that you repeat the steps for each exercise (do more repetitions). If you do not have back pain or discomfort, do these exercises once each day or as told by your health care provider. Exercises Single Knee to Chest  Repeat these steps 3-5 times for each leg: 1. Lie on your back on a firm bed or the floor with your legs extended. 2. Bring one knee to your chest. Your other leg should stay extended and in contact with the floor. 3. Hold your knee in place by grabbing your knee or thigh. 4. Pull on your knee until you feel a gentle stretch in your lower back. 5. Hold the stretch for 10-30 seconds. 6. Slowly release and straighten your leg. Pelvic Tilt  Repeat these steps 5-10 times: 1. Lie on your back on a firm bed or the floor with your legs extended. 2. Bend your knees so they are pointing toward the ceiling and your feet are flat on the floor. 3. Tighten your lower abdominal muscles to press your lower back against the floor. This motion will tilt your pelvis so your tailbone points up toward the ceiling instead of pointing to your feet or the floor. 4. With gentle tension and even breathing, hold this position for 5-10 seconds. Cat-Cow  Repeat these steps until your lower back becomes more flexible: 1. Get into a hands-and-knees position on a firm surface. Keep your hands under your shoulders, and keep your knees under your hips. You may place padding under your knees  for comfort. 2. Let your head hang down, and point your tailbone toward the floor so your lower back becomes rounded like the back of a cat. 3. Hold this position for 5 seconds. 4. Slowly lift your head and point your tailbone up toward the ceiling so your back forms a sagging arch like the back of a cow. 5. Hold this position for 5 seconds. Press-Ups  Repeat these steps 5-10 times: 1. Lie on your abdomen (face-down) on the floor. 2. Place your palms near your head, about shoulder-width apart. 3. While you keep your back as relaxed as possible and keep your hips on the floor, slowly straighten your arms to raise the top half of your body and lift your shoulders. Do not use your back muscles to raise your upper torso. You may adjust the placement of your hands to make yourself more comfortable. 4. Hold this position for 5 seconds while you keep your back relaxed. 5. Slowly return to lying flat on the floor. Bridges  Repeat these steps 10 times: 1. Lie on your back on a firm surface. 2. Bend your knees so they are pointing toward the ceiling and your feet are flat on the floor. 3. Tighten your buttocks muscles and lift your buttocks off of the floor until your waist is at almost the same height as your knees. You should feel the muscles working in your buttocks and the back of  your thighs. If you do not feel these muscles, slide your feet 1-2 inches farther away from your buttocks. 4. Hold this position for 3-5 seconds. 5. Slowly lower your hips to the starting position, and allow your buttocks muscles to relax completely. If this exercise is too easy, try doing it with your arms crossed over your chest. Abdominal Crunches  Repeat these steps 5-10 times: 1. Lie on your back on a firm bed or the floor with your legs extended. 2. Bend your knees so they are pointing toward the ceiling and your feet are flat on the floor. 3. Cross your arms over your chest. 4. Tip your chin slightly toward your  chest without bending your neck. 5. Tighten your abdominal muscles and slowly raise your trunk (torso) high enough to lift your shoulder blades a tiny bit off of the floor. Avoid raising your torso higher than that, because it can put too much stress on your low back and it does not help to strengthen your abdominal muscles. 6. Slowly return to your starting position. Back Lifts  Repeat these steps 5-10 times: 1. Lie on your abdomen (face-down) with your arms at your sides, and rest your forehead on the floor. 2. Tighten the muscles in your legs and your buttocks. 3. Slowly lift your chest off of the floor while you keep your hips pressed to the floor. Keep the back of your head in line with the curve in your back. Your eyes should be looking at the floor. 4. Hold this position for 3-5 seconds. 5. Slowly return to your starting position. Contact a health care provider if:  Your back pain or discomfort gets much worse when you do an exercise.  Your back pain or discomfort does not lessen within 2 hours after you exercise. If you have any of these problems, stop doing these exercises right away. Do not do them again unless your health care provider says that you can. Get help right away if:  You develop sudden, severe back pain. If this happens, stop doing the exercises right away. Do not do them again unless your health care provider says that you can. This information is not intended to replace advice given to you by your health care provider. Make sure you discuss any questions you have with your health care provider. Document Released: 10/09/2004 Document Revised: 01/09/2016 Document Reviewed: 10/26/2014 Elsevier Interactive Patient Education  2017 [employer].

## 2016-10-31 NOTE — Progress Notes (Signed)
Pre visit review using our clinic review tool, if applicable. No additional management support is needed unless otherwise documented below in the visit note. 

## 2016-10-31 NOTE — Progress Notes (Addendum)
Subjective:    Patient ID: Terry Fox, male    DOB: [birth year], 51 y.o.   MRN: [id number]  HPI  Patient presents today for complete physical.  Immunizations: tetanus up to date, flu shot up to date Diet: fair diet Wt Readings from Last 3 Encounters:  10/31/16 213 lb 12.8 oz (97 kg)  05/28/16 215 lb (97.5 kg)  04/30/16 214 lb (97.1 kg)  Exercise:  Not exercising Colonoscopy: due Dental:  Due   Review of Systems  Constitutional: Negative for unexpected weight change.  HENT: Negative for hearing loss and rhinorrhea.   Eyes: Negative for visual disturbance.  Respiratory: Negative for cough.   Cardiovascular: Negative for leg swelling.  Gastrointestinal: Negative for blood in stool, constipation and diarrhea.  Genitourinary: Negative for dysuria and frequency.  Musculoskeletal:       Reports low/mid back pain- for last few months.   Bilateral knee pain R>L  Neurological:       Reports he has not had a bad migraine x 6 weeks.   Hematological: Negative for adenopathy.  Psychiatric/Behavioral:       Denies depression/anxiety    Past Medical History:  Diagnosis Date  . Depression    in his 40's  . Generalized headaches   . GERD (gastroesophageal reflux disease)   . Hernia   . Thyroid disease    hypothyroidism     Social History   Social History  . Marital status: Married    Spouse name: N/A  . Number of children: 6  . Years of education: N/A   Occupational History  . Retired Company secretary    Social History Main Topics  . Smoking status: Current Every Day Smoker    Last attempt to quit: 10/11/1996  . Smokeless tobacco: Current User    Types: Chew     Comment: Pt currenty vapes (3mg  nicotine)  . Alcohol use 7.2 oz/week    12 Cans of beer per week     Comment: 6-8 beers weekly  . Drug use: No  . Sexual activity: Yes   Other Topics Concern  . Not on file   Social History Narrative   Regular exercise:  No   Caffeine Use:  Rarely   Works as a Market researcher.   Smokeless tobacco   Married   2 biological children   4 step children             Past Surgical History:  Procedure Laterality Date  . HERNIA REPAIR  99991111   umbilical hernia repair-- [facility]   . TONSILLECTOMY  1980's    Family History  Problem Relation Age of Onset  . Arthritis Father   . Hyperlipidemia Mother   . Diabetes Neg Hx   . Heart attack Neg Hx   . Hypertension Neg Hx   . Sudden death Neg Hx     No Known Allergies  Current Outpatient Prescriptions on File Prior to Visit  Medication Sig Dispense Refill  . aspirin EC 81 MG tablet Take 81 mg by mouth daily.    Marland Kitchen atorvastatin (LIPITOR) 10 MG tablet TAKE 1 TABLET  (10MG  TOTAL) BY MOUTH DAILY 90 tablet 1  . cholecalciferol (VITAMIN D) 1000 units tablet Take 1,000 Units by mouth daily.    Marland Kitchen levothyroxine (SYNTHROID, LEVOTHROID) 125 MCG tablet TAKE 1 TABLET (125 MCG) BY MOUTH DAILY. 90 tablet 1  . omeprazole (PRILOSEC) 40 MG capsule TAKE 1 CAPSULE BY MOUTH DAILY 90 capsule 1  . SUMAtriptan (IMITREX) 50  MG tablet One tablet at the start of migraine. May repeat once 2 hours later if headache persists. Max 2 tabs in 24 hrs 27 tablet 1  . verapamil (VERELAN) 100 MG 24 hr capsule TAKE 1 CAPSULE (100 MG) BY MOUTH AT BEDTIME. 90 capsule 1   No current facility-administered medications on file prior to visit.     BP 118/78 (BP Location: Right Arm, Cuff Size: Large)   Pulse 60   Temp 98.2 F (36.8 C) (Oral)   Resp 16   Ht 6' (1.829 m)   Wt 213 lb 12.8 oz (97 kg)   SpO2 100% Comment: room air  BMI 29.00 kg/m    Objective:   Physical Exam Physical Exam  Constitutional: He is oriented to person, place, and time. He appears well-developed and well-nourished. No distress.  HENT:  Head: Normocephalic and atraumatic.  Right Ear: Tympanic membrane and ear canal normal.  Left Ear: Tympanic membrane and ear canal normal.  Mouth/Throat: Oropharynx is clear and moist.  Eyes: Pupils are equal, round, and reactive  to light. No scleral icterus.  Neck: Normal range of motion. No thyromegaly present.  Cardiovascular: Normal rate and regular rhythm.   No murmur heard. Pulmonary/Chest: Effort normal and breath sounds normal. No respiratory distress. He has no wheezes. He has no rales. He exhibits no tenderness.  Abdominal: Soft. Bowel sounds are normal. He exhibits no distension and no mass. There is no tenderness. There is no rebound and no guarding.  Musculoskeletal: He exhibits no edema.  Lymphadenopathy:    He has no cervical adenopathy.  Neurological: He is alert and oriented to person, place, and time. He has normal patellar reflexes. He exhibits normal muscle tone. Coordination normal.  Skin: Skin is warm and dry.  Psychiatric: He has a normal mood and affect. His behavior is normal. Judgment and thought content normal.           Assessment & Plan:   Preventative care- discussed healthy diet, exercise, weight loss. Will refer for colonoscopy. The natural history of prostate cancer and ongoing controversy regarding screening and potential treatment outcomes of prostate cancer has been discussed with the patient. The meaning of a false positive PSA and a false negative PSA has been discussed. He indicates understanding of the limitations of this screening test and wishes  to proceed with screening PSA testing. EKG tracing is personally reviewed.  EKG notes NSR.  No acute changes.    Bilateral knee pain- rx with meloxicam, refer back to Dr. Barbaraann Barthel.  Low back pain- rx with meloxicam, pt given exercises to do bid at home.      Assessment & Plan:

## 2016-11-04 ENCOUNTER — Ambulatory Visit (INDEPENDENT_AMBULATORY_CARE_PROVIDER_SITE_OTHER): Payer: PRIVATE HEALTH INSURANCE | Admitting: Family Medicine

## 2016-11-04 ENCOUNTER — Encounter: Payer: Self-pay | Admitting: Family Medicine

## 2016-11-04 ENCOUNTER — Telehealth: Payer: Self-pay

## 2016-11-04 DIAGNOSIS — G8929 Other chronic pain: Secondary | ICD-10-CM | POA: Diagnosis not present

## 2016-11-04 DIAGNOSIS — M25562 Pain in left knee: Secondary | ICD-10-CM

## 2016-11-04 DIAGNOSIS — M25561 Pain in right knee: Secondary | ICD-10-CM | POA: Diagnosis not present

## 2016-11-04 MED ORDER — MELOXICAM 7.5 MG PO TABS: 7.5000 mg | ORAL_TABLET | Freq: Every day | ORAL | 0 refills | Status: DC

## 2016-11-04 NOTE — Telephone Encounter (Signed)
Notified Pt that Rx was sent to pharmacy

## 2016-11-04 NOTE — Patient Instructions (Addendum)
You have patellofemoral syndrome Avoid painful activities when possible (often deep squats, lunges bother this) Straight leg raise, hip side raises, straight leg raises with foot turned outwards 3 sets of 10 once a day. Add ankle weight if these become too easy. Consider formal physical therapy Correct foot breakdown with something like dr. Zoe Fox active series, superfeet insoles. Avoid flat shoes, barefoot walking as much as possible the next 6 weeks. Icing 15 minutes at a time 3-4 times a day as needed. Tylenol or aleve as needed for pain Follow up with me in 6 weeks.

## 2016-11-04 NOTE — Telephone Encounter (Signed)
Sent Rx to [facility] order will not send a 2 week supply

## 2016-11-06 DIAGNOSIS — M25561 Pain in right knee: Secondary | ICD-10-CM | POA: Insufficient documentation

## 2016-11-06 DIAGNOSIS — M25562 Pain in left knee: Secondary | ICD-10-CM

## 2016-11-06 NOTE — Assessment & Plan Note (Signed)
consistent with patellofemoral syndrome, less likely patellofemoral arthritis (none seen on radiographs of tibia/fibula a few years ago).  Reviewed home exercises to do daily, encouraged arch supports.  Icing, tylenol or aleve.  Consider physical therapy, repeat radiographs if not improving.  F/u in 6 weeks.

## 2016-11-06 NOTE — Progress Notes (Signed)
PCP and consultation requested by: Nance Pear., NP  Subjective:   HPI: Patient is a 50 y.o. male here for bilateral knee pain.  Patient reports he's had several years problems with both knees. Pain is anterior, 2/10 level, sharp. Worse with kneeling and squatting, stairs. Has been using a brace, taking aleve. No acute injury or trauma. No swelling. No skin changes, numbness.  Past Medical History:  Diagnosis Date  . Depression    in his 64's  . Generalized headaches   . GERD (gastroesophageal reflux disease)   . Hernia   . Thyroid disease    hypothyroidism    Current Outpatient Prescriptions on File Prior to Visit  Medication Sig Dispense Refill  . aspirin EC 81 MG tablet Take 81 mg by mouth daily.    Marland Kitchen atorvastatin (LIPITOR) 10 MG tablet TAKE 1 TABLET  (10MG  TOTAL) BY MOUTH DAILY 90 tablet 1  . cholecalciferol (VITAMIN D) 1000 units tablet Take 1,000 Units by mouth daily.    Marland Kitchen levothyroxine (SYNTHROID, LEVOTHROID) 125 MCG tablet TAKE 1 TABLET (125 MCG) BY MOUTH DAILY. 90 tablet 1  . omeprazole (PRILOSEC) 40 MG capsule TAKE 1 CAPSULE BY MOUTH DAILY 90 capsule 1  . SUMAtriptan (IMITREX) 50 MG tablet One tablet at the start of migraine. May repeat once 2 hours later if headache persists. Max 2 tabs in 24 hrs 27 tablet 1  . verapamil (VERELAN) 100 MG 24 hr capsule TAKE 1 CAPSULE (100 MG) BY MOUTH AT BEDTIME. 90 capsule 1   No current facility-administered medications on file prior to visit.     Past Surgical History:  Procedure Laterality Date  . HERNIA REPAIR  [id number]   umbilical hernia repair-- [facility]   . TONSILLECTOMY  1980's    No Known Allergies  Social History   Social History  . Marital status: Married    Spouse name: N/A  . Number of children: 6  . Years of education: N/A   Occupational History  . Retired Company secretary    Social History Main Topics  . Smoking status: Current Every Day Smoker    Last attempt to quit: 10/11/1996  . Smokeless tobacco:  Current User    Types: Chew     Comment: Pt currenty vapes (3mg  nicotine)  . Alcohol use 7.2 oz/week    12 Cans of beer per week     Comment: 6-8 beers weekly  . Drug use: No  . Sexual activity: Yes   Other Topics Concern  . Not on file   Social History Narrative   Regular exercise:  No   Caffeine Use:  Rarely   Works as a Theatre stage manager.   Smokeless tobacco   Married   2 biological children   4 step children             Family History  Problem Relation Age of Onset  . Arthritis Father   . Hyperlipidemia Mother   . Diabetes Neg Hx   . Heart attack Neg Hx   . Hypertension Neg Hx   . Sudden death Neg Hx     BP 119/80   Pulse 62   Ht 6' (1.829 m)   Wt 213 lb (96.6 kg)   BMI 28.89 kg/m   Review of Systems: See HPI above.     Objective:  Physical Exam:  Gen: NAD, comfortable in exam room  Bilateral knees: No gross deformity, ecchymoses, effusions.  Mild VMO atrophy.  Mod pronation L > R. No TTP (points to  behind kneecap as area of pain). FROM.  Hip abduction 5/5 strength. Negative ant/post drawers. Negative valgus/varus testing. Negative lachmanns. Negative mcmurrays, apleys, patellar apprehension. NV intact distally.   Assessment & Plan:  1. Bilateral knee pain - consistent with patellofemoral syndrome, less likely patellofemoral arthritis (none seen on radiographs of tibia/fibula a few years ago).  Reviewed home exercises to do daily, encouraged arch supports.  Icing, tylenol or aleve.  Consider physical therapy, repeat radiographs if not improving.  F/u in 6 weeks.

## 2016-12-16 ENCOUNTER — Ambulatory Visit: Payer: PRIVATE HEALTH INSURANCE | Admitting: Family Medicine

## 2016-12-17 ENCOUNTER — Ambulatory Visit (AMBULATORY_SURGERY_CENTER): Payer: Self-pay

## 2016-12-17 VITALS — Ht 72.0 in | Wt 210.4 lb

## 2016-12-17 DIAGNOSIS — Z1211 Encounter for screening for malignant neoplasm of colon: Secondary | ICD-10-CM

## 2016-12-17 MED ORDER — SUPREP BOWEL PREP KIT 17.5-3.13-1.6 GM/177ML PO SOLN
1.0000 | Freq: Once | ORAL | 0 refills | Status: AC
Start: 2016-12-17 — End: 2016-12-17

## 2016-12-17 NOTE — Progress Notes (Signed)
No diet meds No home oxygen No allergies to eggs or soy No past problems with anesthesia  Registered for emmi

## 2016-12-24 ENCOUNTER — Encounter: Payer: Self-pay | Admitting: Gastroenterology

## 2016-12-31 ENCOUNTER — Encounter: Payer: Self-pay | Admitting: Gastroenterology

## 2016-12-31 ENCOUNTER — Ambulatory Visit (AMBULATORY_SURGERY_CENTER): Payer: PRIVATE HEALTH INSURANCE | Admitting: Gastroenterology

## 2016-12-31 VITALS — BP 99/71 | HR 65 | Temp 97.7°F | Resp 13 | Ht 72.0 in | Wt 210.0 lb

## 2016-12-31 DIAGNOSIS — D122 Benign neoplasm of ascending colon: Secondary | ICD-10-CM

## 2016-12-31 DIAGNOSIS — Z1212 Encounter for screening for malignant neoplasm of rectum: Secondary | ICD-10-CM

## 2016-12-31 DIAGNOSIS — Z1211 Encounter for screening for malignant neoplasm of colon: Secondary | ICD-10-CM

## 2016-12-31 MED ORDER — SODIUM CHLORIDE 0.9 % IV SOLN: 500.0000 mL | INTRAVENOUS | Status: DC

## 2016-12-31 NOTE — Progress Notes (Signed)
Called to room to assist during endoscopic procedure.  Patient ID and intended procedure confirmed with present staff. Received instructions for my participation in the procedure from the performing physician.  

## 2016-12-31 NOTE — Patient Instructions (Signed)
Discharge instructions given. Handouts on polyps and diverticulosis. Resume previous medications. No ibuprofen, naproxen, or other NSAIDs for two weeks. YOU HAD AN ENDOSCOPIC PROCEDURE TODAY AT THE  [facility]:   Refer to the procedure report that was given to you for any specific questions about what was found during the examination.  If the procedure report does not answer your questions, please call your gastroenterologist to clarify.  If you requested that your care partner not be given the details of your procedure findings, then the procedure report has been included in a sealed envelope for you to review at your convenience later.  YOU SHOULD EXPECT: Some feelings of bloating in the abdomen. Passage of more gas than usual.  Walking can help get rid of the air that was put into your GI tract during the procedure and reduce the bloating. If you had a lower endoscopy (such as a colonoscopy or flexible sigmoidoscopy) you may notice spotting of blood in your stool or on the toilet paper. If you underwent a bowel prep for your procedure, you may not have a normal bowel movement for a few days.  Please Note:  You might notice some irritation and congestion in your nose or some drainage.  This is from the oxygen used during your procedure.  There is no need for concern and it should clear up in a day or so.  SYMPTOMS TO REPORT IMMEDIATELY:   Following lower endoscopy (colonoscopy or flexible sigmoidoscopy):  Excessive amounts of blood in the stool  Significant tenderness or worsening of abdominal pains  Swelling of the abdomen that is new, acute  Fever of 100F or higher   For urgent or emergent issues, a gastroenterologist can be reached at any hour by calling [phone].   DIET:  We do recommend a small meal at first, but then you may proceed to your regular diet.  Drink plenty of fluids but you should avoid alcoholic beverages for 24 hours.  ACTIVITY:  You should plan to  take it easy for the rest of today and you should NOT DRIVE or use heavy machinery until tomorrow (because of the sedation medicines used during the test).    FOLLOW UP: Our staff will call the number listed on your records the next business day following your procedure to check on you and address any questions or concerns that you may have regarding the information given to you following your procedure. If we do not reach you, we will leave a message.  However, if you are feeling well and you are not experiencing any problems, there is no need to return our call.  We will assume that you have returned to your regular daily activities without incident.  If any biopsies were taken you will be contacted by phone or by letter within the next 1-3 weeks.  Please call us at [phone] if you have not heard about the biopsies in 3 weeks.    SIGNATURES/CONFIDENTIALITY: You and/or your care partner have signed paperwork which will be entered into your electronic medical record.  These signatures attest to the fact that that the information above on your After Visit Summary has been reviewed and is understood.  Full responsibility of the confidentiality of this discharge information lies with you and/or your care-partner. 

## 2016-12-31 NOTE — Progress Notes (Signed)
Pt's states no medical or surgical changes since previsit or office visit. maw 

## 2016-12-31 NOTE — Op Note (Signed)
[facility] Patient Name: Terry Fox Procedure Date: 12/31/2016 8:32 AM MRN: [id number] Endoscopist: Remo Lipps P. Derek Laughter MD, MD Age: 51 Referring MD:  Date of Birth: 01/06/1966 Gender: Male Account #: [id number] Procedure:                Colonoscopy Indications:              Screening for colorectal malignant neoplasm, This                            is the patient's first colonoscopy Medicines:                Monitored Anesthesia Care Procedure:                Pre-Anesthesia Assessment:                           - Prior to the procedure, a History and Physical                            was performed, and patient medications and                            allergies were reviewed. The patient's tolerance of                            previous anesthesia was also reviewed. The risks                            and benefits of the procedure and the sedation                            options and risks were discussed with the patient.                            All questions were answered, and informed consent                            was obtained. Prior Anticoagulants: The patient has                            taken aspirin, last dose was 1 day prior to                            procedure. ASA Grade Assessment: II - A patient                            with mild systemic disease. After reviewing the                            risks and benefits, the patient was deemed in                            satisfactory condition to undergo the procedure.  After obtaining informed consent, the colonoscope                            was passed under direct vision. Throughout the                            procedure, the patient's blood pressure, pulse, and                            oxygen saturations were monitored continuously. The                            Model CF-HQ190L [id number]) scope was introduced                            through the anus and  advanced to the the cecum,                            identified by appendiceal orifice and ileocecal                            valve. The colonoscopy was performed without                            difficulty. The patient tolerated the procedure                            well. The quality of the bowel preparation was                            good. The ileocecal valve, appendiceal orifice, and                            rectum were photographed. Scope In: 8:40:05 AM Scope Out: 8:53:45 AM Scope Withdrawal Time: 0 hours 11 minutes 28 seconds  Total Procedure Duration: 0 hours 13 minutes 40 seconds  Findings:                 The perianal and digital rectal examinations were                            normal.                           A 4 mm polyp was found in the ascending colon. The                            polyp was sessile. The polyp was removed with a                            cold snare. Resection and retrieval were complete.                           A few medium-mouthed diverticula were found in the  sigmoid colon.                           The exam was otherwise without abnormality. Complications:            No immediate complications. Estimated blood loss:                            Minimal. Estimated Blood Loss:     Estimated blood loss was minimal. Impression:               - One 4 mm polyp in the ascending colon, removed                            with a cold snare. Resected and retrieved.                           - Diverticulosis in the sigmoid colon.                           - The examination was otherwise normal. Recommendation:           - Patient has a contact number available for                            emergencies. The signs and symptoms of potential                            delayed complications were discussed with the                            patient. Return to normal activities tomorrow.                            Written  discharge instructions were provided to the                            patient.                           - Resume previous diet.                           - Continue present medications.                           - Await pathology results.                           - Repeat colonoscopy is recommended for                            surveillance. The colonoscopy date will be                            determined after pathology results from today's  exam become available for review.                           - No ibuprofen, naproxen, or other non-steroidal                            anti-inflammatory drugs for 2 weeks after polyp                            removal. Remo Lipps P. [name] MD, MD 12/31/2016 8:56:32 AM This report has been signed electronically.

## 2017-01-01 ENCOUNTER — Telehealth: Payer: Self-pay | Admitting: *Deleted

## 2017-01-01 NOTE — Telephone Encounter (Signed)
  Follow up Call-  Call back number 12/31/2016  Post procedure Call Back phone  # [phone] cell  Permission to leave phone message Yes  Some recent data might be hidden     Patient questions:  Do you have a fever, pain , or abdominal swelling? No. Pain Score  0 *  Have you tolerated food without any problems? Yes.    Have you been able to return to your normal activities? Yes.    Do you have any questions about your discharge instructions: Diet   No. Medications  No. Follow up visit  No.  Do you have questions or concerns about your Care? No.  Actions: * If pain score is 4 or above: No action needed, pain <4.

## 2017-01-01 NOTE — Telephone Encounter (Signed)
  Follow up Call-  Call back number 12/31/2016  Post procedure Call Back phone  # [phone] cell  Permission to leave phone message Yes  Some recent data might be hidden   [facility]

## 2017-01-06 ENCOUNTER — Encounter: Payer: Self-pay | Admitting: Gastroenterology

## 2017-02-12 ENCOUNTER — Other Ambulatory Visit: Payer: Self-pay | Admitting: Family

## 2017-02-12 NOTE — Telephone Encounter (Signed)
Rx approved and sent to the pharmacy by e-script.//AB/CMA 

## 2017-03-31 ENCOUNTER — Other Ambulatory Visit: Payer: Self-pay | Admitting: Family

## 2017-05-21 ENCOUNTER — Telehealth: Payer: Self-pay | Admitting: Family

## 2017-05-21 NOTE — Telephone Encounter (Signed)
Pt request refill verapamil, levothoroxin, and atorvastain. Pt uses [facility] on [address] on [address]. Pt states mail order is closed he cant get them there anymore. Please use [facility]. Pt ph [phone].

## 2017-05-22 MED ORDER — ATORVASTATIN CALCIUM 10 MG PO TABS: ORAL_TABLET | ORAL | 0 refills | Status: DC

## 2017-05-22 MED ORDER — VERAPAMIL HCL ER 100 MG PO CP24: ORAL_CAPSULE | ORAL | 0 refills | Status: DC

## 2017-05-22 MED ORDER — LEVOTHYROXINE SODIUM 125 MCG PO TABS: ORAL_TABLET | ORAL | 0 refills | Status: DC

## 2017-05-22 NOTE — Addendum Note (Signed)
Addended by: Kelle Darting A on: 05/22/2017 06:39 PM   Modules accepted: Orders

## 2017-05-22 NOTE — Telephone Encounter (Signed)
90 day supply sent to below pharmacy on meds requested below. Notified pt. Pt last seen 10/2016 and has no future appts scheduled. When should pt follow up in the office?

## 2017-05-23 NOTE — Telephone Encounter (Signed)
6 month pls.

## 2017-05-25 NOTE — Telephone Encounter (Signed)
Email sent to pt that he is due now for 6 month follow up and to call and schedule appt soon.

## 2017-06-15 ENCOUNTER — Ambulatory Visit (INDEPENDENT_AMBULATORY_CARE_PROVIDER_SITE_OTHER): Payer: PRIVATE HEALTH INSURANCE | Admitting: Family

## 2017-06-15 ENCOUNTER — Encounter: Payer: Self-pay | Admitting: Family

## 2017-06-15 VITALS — BP 121/70 | HR 66 | Temp 98.4°F | Resp 16 | Ht 72.0 in | Wt 213.0 lb

## 2017-06-15 DIAGNOSIS — E039 Hypothyroidism, unspecified: Secondary | ICD-10-CM

## 2017-06-15 DIAGNOSIS — E785 Hyperlipidemia, unspecified: Secondary | ICD-10-CM | POA: Diagnosis not present

## 2017-06-15 DIAGNOSIS — K219 Gastro-esophageal reflux disease without esophagitis: Secondary | ICD-10-CM

## 2017-06-15 DIAGNOSIS — Z23 Encounter for immunization: Secondary | ICD-10-CM | POA: Diagnosis not present

## 2017-06-15 DIAGNOSIS — G43909 Migraine, unspecified, not intractable, without status migrainosus: Secondary | ICD-10-CM

## 2017-06-15 MED ORDER — ATORVASTATIN CALCIUM 10 MG PO TABS: ORAL_TABLET | ORAL | 0 refills | Status: DC

## 2017-06-15 MED ORDER — OMEPRAZOLE 40 MG PO CPDR: 40.0000 mg | DELAYED_RELEASE_CAPSULE | Freq: Every day | ORAL | 1 refills | Status: DC

## 2017-06-15 NOTE — Patient Instructions (Signed)
Plase complete lab work prior to leaving.

## 2017-06-15 NOTE — Assessment & Plan Note (Signed)
Migraines are stable. Having some occasional nonmigraine headaches. These are controlled with when necessary Goody's or BC powders. He is using these sparingly.

## 2017-06-15 NOTE — Assessment & Plan Note (Signed)
Stable on current dose of omeprazole.

## 2017-06-15 NOTE — Progress Notes (Signed)
Subjective:    Patient ID: Terry Fox, male    DOB: July 08, 1966, 51 y.o.   MRN: [id number]  HPI   Patient is a 51 year old male who presents today for follow-up.  Hyperlipidemia- on lipitor, denies myalgia.  Diet is fair.   Lab Results  Component Value Date   CHOL 131 10/31/2016   HDL 27.80 (L) 10/31/2016   LDLCALC 66 10/31/2016   LDLDIRECT 92.0 04/25/2016   TRIG 183.0 (H) 10/31/2016   CHOLHDL 5 10/31/2016   Hypothyroid- Maintained on synthroid.  Feels well on this dose. Lab Results  Component Value Date   TSH 1.46 10/31/2016   GERD- Stable on ppi, does not tolerate missing doses.   Migraines- no recent migraines.  Has "headaches."  Reports that he treats these headaches with BC's or Goodies.  1-2 times/week.    Review of Systems See HPI  Past Medical History:  Diagnosis Date  . Allergy   . Arthritis   . Depression    in his 3's  . Generalized headaches   . GERD (gastroesophageal reflux disease)   . Hernia   . Thyroid disease    hypothyroidism     Social History   Social History  . Marital status: Married    Spouse name: N/A  . Number of children: 6  . Years of education: N/A   Occupational History  . Retired Company secretary    Social History Main Topics  . Smoking status: Former Smoker    Types: E-cigarettes    Quit date: 10/11/1996  . Smokeless tobacco: Former Systems developer    Types: Chew     Comment: Pt currenty vapes (3mg  nicotine)  . Alcohol use 7.2 oz/week    12 Cans of beer per week     Comment: 6-8 beers weekly  . Drug use: No  . Sexual activity: Yes   Other Topics Concern  . Not on file   Social History Narrative   Regular exercise:  No   Caffeine Use:  Rarely   Works as a Theatre stage manager.   Smokeless tobacco   Married   2 biological children   4 step children             Past Surgical History:  Procedure Laterality Date  . HERNIA REPAIR  11/18/98   umbilical hernia repair-- [facility]   . TONSILLECTOMY  1980's    Family History    Problem Relation Age of Onset  . Arthritis Father   . Hyperlipidemia Mother   . Heart attack Neg Hx   . Hypertension Neg Hx   . Sudden death Neg Hx   . Colon cancer Neg Hx   . Esophageal cancer Neg Hx   . Pancreatic cancer Neg Hx   . Prostate cancer Neg Hx   . Rectal cancer Neg Hx   . Stomach cancer Neg Hx     No Known Allergies  Current Outpatient Prescriptions on File Prior to Visit  Medication Sig Dispense Refill  . aspirin EC 81 MG tablet Take 81 mg by mouth daily.    . Aspirin-Acetaminophen-Caffeine (GOODY HEADACHE PO) Take by mouth 4 (four) times daily.    Marland Kitchen atorvastatin (LIPITOR) 10 MG tablet TAKE 1 TABLET  (10MG  TOTAL) BY MOUTH DAILY 90 tablet 0  . Cholecalciferol (VITAMIN D) 2000 units CAPS Take 2,000 Units by mouth daily.     Marland Kitchen levothyroxine (SYNTHROID, LEVOTHROID) 125 MCG tablet TAKE 1 TABLET (125 MCG) BY MOUTH DAILY. 90 tablet 0  . naproxen sodium (  ANAPROX) 220 MG tablet Take 220 mg by mouth 2 (two) times daily with a meal.    . omeprazole (PRILOSEC) 40 MG capsule TAKE 1 CAPSULE BY MOUTH DAILY 90 capsule 1  . SUMAtriptan (IMITREX) 50 MG tablet One tablet at the start of migraine. May repeat once 2 hours later if headache persists. Max 2 tabs in 24 hrs 27 tablet 1  . verapamil (VERELAN) 100 MG 24 hr capsule TAKE 1 CAPSULE (100 MG) BY MOUTH AT BEDTIME. 90 capsule 0   Current Facility-Administered Medications on File Prior to Visit  Medication Dose Route Frequency Provider Last Rate Last Dose  . 0.9 %  sodium chloride infusion  500 mL Intravenous Continuous Armbruster, Carlota Raspberry, MD        BP 121/70 (BP Location: Right Arm, Cuff Size: Large)   Pulse 66   Temp 98.4 F (36.9 C) (Oral)   Resp 16   Ht 6' (1.829 m)   Wt 213 lb (96.6 kg)   SpO2 98%   BMI 28.89 kg/m       Objective:   Physical Exam  Constitutional: He is oriented to person, place, and time. He appears well-developed and well-nourished. No distress.  HENT:  Head: Normocephalic and atraumatic.   Cardiovascular: Normal rate and regular rhythm.   No murmur heard. Pulmonary/Chest: Effort normal and breath sounds normal. No respiratory distress. He has no wheezes. He has no rales.  Musculoskeletal: He exhibits no edema.  Neurological: He is alert and oriented to person, place, and time.  Skin: Skin is warm and dry.  Psychiatric: He has a normal mood and affect. His behavior is normal. Thought content normal.          Assessment & Plan:

## 2017-06-15 NOTE — Assessment & Plan Note (Signed)
Clinical stable on current dose of Synthroid, continue same. Obtain follow-up TSH.

## 2017-06-15 NOTE — Assessment & Plan Note (Signed)
LDL at goal. Trigs improved but mildly elevated.  Continue statin, plan flp next visit.

## 2017-06-16 LAB — TSH: TSH: 2.58 u[IU]/mL (ref 0.35–4.50)

## 2017-06-29 ENCOUNTER — Telehealth: Payer: Self-pay | Admitting: Family

## 2017-07-09 NOTE — Telephone Encounter (Signed)
Pt called in to follow up on refill request. Pt says that he also need atorvastatin refilled also.   Please assist pt further.

## 2017-07-13 NOTE — Telephone Encounter (Signed)
Levothyroxine and Verapamil Rxs were sent on 05/22/17 for 90 day supply. Atorvastatin sent for 90 day supply on 06/15/17. Notified pt and advised him to speak with pharmacy to check for Rxs that were probably placed on hold and to call us back if he has any further difficulty filling Rxs after that.

## 2017-08-11 ENCOUNTER — Encounter: Payer: Self-pay | Admitting: Family

## 2017-08-11 MED ORDER — OMEPRAZOLE 40 MG PO CPDR: 40.0000 mg | DELAYED_RELEASE_CAPSULE | Freq: Every day | ORAL | 1 refills | Status: DC

## 2017-08-11 MED ORDER — ATORVASTATIN CALCIUM 10 MG PO TABS: ORAL_TABLET | ORAL | 1 refills | Status: DC

## 2017-08-11 MED ORDER — LEVOTHYROXINE SODIUM 125 MCG PO TABS: ORAL_TABLET | ORAL | 1 refills | Status: DC

## 2017-08-11 MED ORDER — SUMATRIPTAN SUCCINATE 50 MG PO TABS: ORAL_TABLET | ORAL | 1 refills | Status: DC

## 2017-08-11 MED ORDER — VERAPAMIL HCL ER 100 MG PO CP24: ORAL_CAPSULE | ORAL | 1 refills | Status: DC

## 2017-09-16 ENCOUNTER — Ambulatory Visit (INDEPENDENT_AMBULATORY_CARE_PROVIDER_SITE_OTHER): Payer: BLUE CROSS/BLUE SHIELD | Admitting: Family

## 2017-09-16 ENCOUNTER — Encounter: Payer: Self-pay | Admitting: Family

## 2017-09-16 VITALS — BP 126/82 | HR 66 | Temp 98.2°F | Resp 16 | Ht 72.0 in | Wt 214.0 lb

## 2017-09-16 DIAGNOSIS — R632 Polyphagia: Secondary | ICD-10-CM

## 2017-09-16 DIAGNOSIS — R5383 Other fatigue: Secondary | ICD-10-CM | POA: Diagnosis not present

## 2017-09-16 LAB — CBC WITH DIFFERENTIAL/PLATELET
Basophils Absolute: 0.1 K/uL (ref 0.0–0.1)
Basophils Relative: 0.8 % (ref 0.0–3.0)
Eosinophils Absolute: 0.1 K/uL (ref 0.0–0.7)
Eosinophils Relative: 1.9 % (ref 0.0–5.0)
HCT: 45.1 % (ref 39.0–52.0)
Hemoglobin: 15.1 g/dL (ref 13.0–17.0)
Lymphocytes Relative: 28.5 % (ref 12.0–46.0)
Lymphs Abs: 2 K/uL (ref 0.7–4.0)
MCHC: 33.4 g/dL (ref 30.0–36.0)
MCV: 92.6 fl (ref 78.0–100.0)
Monocytes Absolute: 0.7 K/uL (ref 0.1–1.0)
Monocytes Relative: 10.4 % (ref 3.0–12.0)
Neutro Abs: 4.1 K/uL (ref 1.4–7.7)
Neutrophils Relative %: 58.4 % (ref 43.0–77.0)
Platelets: 192 K/uL (ref 150.0–400.0)
RBC: 4.87 Mil/uL (ref 4.22–5.81)
RDW: 13 % (ref 11.5–15.5)
WBC: 7.1 K/uL (ref 4.0–10.5)

## 2017-09-16 LAB — TSH: TSH: 1.33 u[IU]/mL (ref 0.35–4.50)

## 2017-09-16 LAB — COMPREHENSIVE METABOLIC PANEL
ALBUMIN: 4.7 g/dL (ref 3.5–5.2)
ALK PHOS: 93 U/L (ref 39–117)
ALT: 28 U/L (ref 0–53)
AST: 20 U/L (ref 0–37)
BUN: 20 mg/dL (ref 6–23)
CO2: 28 mEq/L (ref 19–32)
CREATININE: 1.34 mg/dL (ref 0.40–1.50)
Calcium: 9.7 mg/dL (ref 8.4–10.5)
Chloride: 103 mEq/L (ref 96–112)
GFR: 59.66 mL/min — ABNORMAL LOW (ref >60.00)
GLUCOSE: 86 mg/dL (ref 70–99)
Potassium: 5.4 mEq/L — ABNORMAL HIGH (ref 3.5–5.1)
Sodium: 140 mEq/L (ref 135–145)
TOTAL PROTEIN: 7.9 g/dL (ref 6.0–8.3)
Total Bilirubin: 1.9 mg/dL — ABNORMAL HIGH (ref 0.2–1.2)

## 2017-09-16 NOTE — Patient Instructions (Signed)
Please complete lab work prior to leaving.   

## 2017-09-16 NOTE — Progress Notes (Signed)
Subjective:    Patient ID: Terry Fox, male    DOB: [birth year], 52 y.o.   MRN: [id number]  HPI  Pt reports fatigue- comes home from work- more tired than normal. Falls asleep watching TV which is new.  He snores.  Tired even if he has 4-5 hours of sleep.   Wt Readings from Last 3 Encounters:  09/16/17 214 lb (97.1 kg)  06/15/17 213 lb (96.6 kg)  12/31/16 210 lb (95.3 kg)   Activity is unchanged.  Denies abd pain, nausea/vomitting.   Review of Systems See HPI  Past Medical History:  Diagnosis Date  . Allergy   . Arthritis   . Depression    in his 81's  . Generalized headaches   . GERD (gastroesophageal reflux disease)   . Hernia   . Thyroid disease    hypothyroidism     Social History   Socioeconomic History  . Marital status: Married    Spouse name: Not on file  . Number of children: 6  . Years of education: Not on file  . Highest education level: Not on file  Social Needs  . Financial resource strain: Not on file  . Food insecurity - worry: Not on file  . Food insecurity - inability: Not on file  . Transportation needs - medical: Not on file  . Transportation needs - non-medical: Not on file  Occupational History  . Occupation: Retired Medical illustrator  . Smoking status: Former Smoker    Types: E-cigarettes    Last attempt to quit: 10/11/1996    Years since quitting: 20.9  . Smokeless tobacco: Former Systems developer    Types: Chew  . Tobacco comment: Pt currenty vapes (3mg  nicotine)  Substance and Sexual Activity  . Alcohol use: Yes    Alcohol/week: 7.2 oz    Types: 12 Cans of beer per week    Comment: 6-8 beers weekly  . Drug use: No  . Sexual activity: Yes  Other Topics Concern  . Not on file  Social History Narrative   Regular exercise:  No   Caffeine Use:  Rarely   Works as a Theatre stage manager.   Smokeless tobacco   Married   2 biological children   4 step children             Past Surgical History:  Procedure Laterality Date  . HERNIA  REPAIR  05/22/34   umbilical hernia repair-- [facility]   . TONSILLECTOMY  1980's    Family History  Problem Relation Age of Onset  . Arthritis Father   . Hyperlipidemia Mother   . Heart attack Neg Hx   . Hypertension Neg Hx   . Sudden death Neg Hx   . Colon cancer Neg Hx   . Esophageal cancer Neg Hx   . Pancreatic cancer Neg Hx   . Prostate cancer Neg Hx   . Rectal cancer Neg Hx   . Stomach cancer Neg Hx     No Known Allergies  Current Outpatient Medications on File Prior to Visit  Medication Sig Dispense Refill  . aspirin EC 81 MG tablet Take 81 mg by mouth daily.    . Aspirin-Acetaminophen-Caffeine (GOODY HEADACHE PO) Take by mouth 4 (four) times daily.    Marland Kitchen atorvastatin (LIPITOR) 10 MG tablet TAKE 1 TABLET  (10MG  TOTAL) BY MOUTH DAILY 90 tablet 1  . Cholecalciferol (VITAMIN D) 2000 units CAPS Take 2,000 Units by mouth daily.     Marland Kitchen levothyroxine (SYNTHROID, LEVOTHROID)  125 MCG tablet TAKE 1 TABLET (125 MCG) BY MOUTH DAILY. 90 tablet 1  . naproxen sodium (ANAPROX) 220 MG tablet Take 220 mg by mouth 2 (two) times daily with a meal.    . omeprazole (PRILOSEC) 40 MG capsule Take 1 capsule (40 mg total) by mouth daily. 90 capsule 1  . SUMAtriptan (IMITREX) 50 MG tablet One tablet at the start of migraine. May repeat once 2 hours later if headache persists. Max 2 tabs in 24 hrs 27 tablet 1  . verapamil (VERELAN) 100 MG 24 hr capsule TAKE 1 CAPSULE (100 MG) BY MOUTH AT BEDTIME. 90 capsule 1   Current Facility-Administered Medications on File Prior to Visit  Medication Dose Route Frequency Provider Last Rate Last Dose  . 0.9 %  sodium chloride infusion  500 mL Intravenous Continuous Armbruster, Carlota Raspberry, MD        BP 126/82 (BP Location: Right Arm, Patient Position: Sitting, Cuff Size: Large)   Pulse 66   Temp 98.2 F (36.8 C) (Oral)   Resp 16   Ht 6' (1.829 m)   Wt 214 lb (97.1 kg)   SpO2 99%   BMI 29.02 kg/m       Objective:   Physical Exam  Constitutional: He is oriented  to person, place, and time. He appears well-developed and well-nourished. No distress.  HENT:  Head: Normocephalic and atraumatic.  Cardiovascular: Normal rate and regular rhythm.  No murmur heard. Pulmonary/Chest: Effort normal and breath sounds normal. No respiratory distress. He has no wheezes. He has no rales.  Musculoskeletal: He exhibits no edema.  Neurological: He is alert and oriented to person, place, and time.  Skin: Skin is warm and dry.  Psychiatric: He has a normal mood and affect. His behavior is normal. Thought content normal.          Assessment & Plan:  Fatigue-could be viral etiology.  However I would like to check his thyroid as well as his complete metabolic panel and CBC to further evaluate for other possible continuing factors such as hypothyroid and anemia.  Due to his snoring history if symptoms fail to improve would consider sleep study.  Increase appetite-I reviewed his medication list I do not see any obvious medications that could be contributing to this symptom.  Will check above labs for further evaluation.  In the meantime we discussed healthy food choices and staying active to avoid weight gain.

## 2017-09-17 ENCOUNTER — Telehealth: Payer: Self-pay | Admitting: Family

## 2017-09-17 DIAGNOSIS — E875 Hyperkalemia: Secondary | ICD-10-CM

## 2017-09-17 DIAGNOSIS — R17 Unspecified jaundice: Secondary | ICD-10-CM

## 2017-09-17 NOTE — Telephone Encounter (Signed)
Potassium is elevated.  Please repeat bmet in 1 week, dx hyperkalemia.  Also, bilirubin (one of his liver tests) is elevated. I would like him to complete additional labs as pended. Sometimes this is due to a condition called Gilberts disease which is common and not dangerous- just causes elevated bilirubin. Thyroid and blood count look good.

## 2017-09-18 NOTE — Telephone Encounter (Signed)
Notified pt and he voices understanding. Lab appt scheduled for 09/25/17 at 4pm. Future lab orders signed.

## 2017-09-25 ENCOUNTER — Other Ambulatory Visit (INDEPENDENT_AMBULATORY_CARE_PROVIDER_SITE_OTHER): Payer: BLUE CROSS/BLUE SHIELD

## 2017-09-25 DIAGNOSIS — E875 Hyperkalemia: Secondary | ICD-10-CM | POA: Diagnosis not present

## 2017-09-25 DIAGNOSIS — R17 Unspecified jaundice: Secondary | ICD-10-CM

## 2017-09-25 LAB — BASIC METABOLIC PANEL
BUN: 19 mg/dL (ref 7–25)
CALCIUM: 9.1 mg/dL (ref 8.6–10.3)
CHLORIDE: 107 mmol/L (ref 98–110)
CO2: 27 mmol/L (ref 20–32)
CREATININE: 1.18 mg/dL (ref 0.70–1.33)
Glucose, Bld: 94 mg/dL (ref 65–99)
Potassium: 4.7 mmol/L (ref 3.5–5.3)
Sodium: 140 mmol/L (ref 135–146)

## 2017-09-25 LAB — BILIRUBIN, FRACTIONATED(TOT/DIR/INDIR)
BILIRUBIN DIRECT: 0.2 mg/dL (ref 0.0–0.2)
BILIRUBIN TOTAL: 1.4 mg/dL — AB (ref 0.2–1.2)
Indirect Bilirubin: 1.2 mg/dL (calc) (ref 0.2–1.2)

## 2017-09-25 NOTE — Addendum Note (Signed)
Addended by: Caffie Pinto on: 09/25/2017 02:24 PM   Modules accepted: Orders

## 2017-09-28 ENCOUNTER — Encounter: Payer: Self-pay | Admitting: Family

## 2017-10-09 ENCOUNTER — Ambulatory Visit (INDEPENDENT_AMBULATORY_CARE_PROVIDER_SITE_OTHER): Payer: BLUE CROSS/BLUE SHIELD | Admitting: Family

## 2017-10-09 ENCOUNTER — Encounter: Payer: Self-pay | Admitting: Family

## 2017-10-09 VITALS — BP 108/63 | HR 60 | Temp 98.4°F | Resp 16 | Ht 72.0 in | Wt 215.0 lb

## 2017-10-09 DIAGNOSIS — R5383 Other fatigue: Secondary | ICD-10-CM

## 2017-10-09 DIAGNOSIS — R632 Polyphagia: Secondary | ICD-10-CM | POA: Diagnosis not present

## 2017-10-09 NOTE — Progress Notes (Signed)
Subjective:    Patient ID: Terry Fox, male    DOB: 1965-11-27, 52 y.o.   MRN: [id number]  HPI  Terry Fox is a 52 yr old male who presents today with chief complaint of fatigue.  Last visit he described fatigue and we obtained TSH/CBC.  He reports that his fatigue is improved.   Lab Results  Component Value Date   TSH 1.33 09/16/2017   Lab Results  Component Value Date   WBC 7.1 09/16/2017   HGB 15.1 09/16/2017   HCT 45.1 09/16/2017   MCV 92.6 09/16/2017   PLT 192.0 09/16/2017     He also noted increased appetite. He reports appetite has returned to normal.  Wt Readings from Last 3 Encounters:  10/09/17 215 lb (97.5 kg)  09/16/17 214 lb (97.1 kg)  06/15/17 213 lb (96.6 kg)      Review of Systems See HPI  Past Medical History:  Diagnosis Date  . Allergy   . Arthritis   . Depression    in his 29's  . Generalized headaches   . GERD (gastroesophageal reflux disease)   . Hernia   . Thyroid disease    hypothyroidism     Social History   Socioeconomic History  . Marital status: Married    Spouse name: Not on file  . Number of children: 6  . Years of education: Not on file  . Highest education level: Not on file  Social Needs  . Financial resource strain: Not on file  . Food insecurity - worry: Not on file  . Food insecurity - inability: Not on file  . Transportation needs - medical: Not on file  . Transportation needs - non-medical: Not on file  Occupational History  . Occupation: Retired Medical illustrator  . Smoking status: Former Smoker    Types: E-cigarettes    Last attempt to quit: 10/11/1996    Years since quitting: 21.0  . Smokeless tobacco: Former Systems developer    Types: Chew  . Tobacco comment: Pt currenty vapes (3mg  nicotine)  Substance and Sexual Activity  . Alcohol use: Yes    Alcohol/week: 7.2 oz    Types: 12 Cans of beer per week    Comment: 6-8 beers weekly  . Drug use: No  . Sexual activity: Yes  Other Topics Concern  . Not on  file  Social History Narrative   Regular exercise:  No   Caffeine Use:  Rarely   Works as a Theatre stage manager.   Smokeless tobacco   Married   2 biological children   4 step children             Past Surgical History:  Procedure Laterality Date  . HERNIA REPAIR  03/22/28   umbilical hernia repair-- [facility]   . TONSILLECTOMY  1980's    Family History  Problem Relation Age of Onset  . Arthritis Father   . Hyperlipidemia Mother   . Heart attack Neg Hx   . Hypertension Neg Hx   . Sudden death Neg Hx   . Colon cancer Neg Hx   . Esophageal cancer Neg Hx   . Pancreatic cancer Neg Hx   . Prostate cancer Neg Hx   . Rectal cancer Neg Hx   . Stomach cancer Neg Hx     No Known Allergies  Current Outpatient Medications on File Prior to Visit  Medication Sig Dispense Refill  . Aspirin-Acetaminophen-Caffeine (GOODY HEADACHE PO) Take by mouth 4 (four) times daily.    Marland Kitchen  atorvastatin (LIPITOR) 10 MG tablet TAKE 1 TABLET  (10MG  TOTAL) BY MOUTH DAILY 90 tablet 1  . Cholecalciferol (VITAMIN D) 2000 units CAPS Take 2,000 Units by mouth daily.     Marland Kitchen levothyroxine (SYNTHROID, LEVOTHROID) 125 MCG tablet TAKE 1 TABLET (125 MCG) BY MOUTH DAILY. 90 tablet 1  . naproxen sodium (ANAPROX) 220 MG tablet Take 220 mg by mouth 2 (two) times daily with a meal.    . omeprazole (PRILOSEC) 40 MG capsule Take 1 capsule (40 mg total) by mouth daily. 90 capsule 1  . SUMAtriptan (IMITREX) 50 MG tablet One tablet at the start of migraine. May repeat once 2 hours later if headache persists. Max 2 tabs in 24 hrs 27 tablet 1  . verapamil (VERELAN) 100 MG 24 hr capsule TAKE 1 CAPSULE (100 MG) BY MOUTH AT BEDTIME. 90 capsule 1   Current Facility-Administered Medications on File Prior to Visit  Medication Dose Route Frequency Provider Last Rate Last Dose  . 0.9 %  sodium chloride infusion  500 mL Intravenous Continuous Armbruster, Carlota Raspberry, MD        BP 108/63 (BP Location: Left Arm, Patient Position: Sitting, Cuff  Size: Large)   Pulse 60   Temp 98.4 F (36.9 C) (Oral)   Resp 16   Ht 6' (1.829 m)   Wt 215 lb (97.5 kg)   SpO2 97%   BMI 29.16 kg/m       Objective:   Physical Exam  Constitutional: He is oriented to person, place, and time. He appears well-developed and well-nourished. No distress.  HENT:  Head: Normocephalic and atraumatic.  Cardiovascular: Normal rate and regular rhythm.  No murmur heard. Pulmonary/Chest: Effort normal and breath sounds normal. No respiratory distress. He has no wheezes. He has no rales.  Musculoskeletal: He exhibits no edema.  Neurological: He is alert and oriented to person, place, and time.  Skin: Skin is warm and dry.  Psychiatric: He has a normal mood and affect. His behavior is normal. Thought content normal.          Assessment & Plan:  Fatigue- improved, suspect viral etiology.    Increased appetite- has returned to normal. Discussed watching portions to prevent further weight gain and promote weight loss.   I did advise him to d/c aspirin as current research indicates risk>benefit as primary prevention without DM2.

## 2017-10-09 NOTE — Patient Instructions (Signed)
Please follow up as scheduled.

## 2017-11-11 ENCOUNTER — Telehealth: Payer: Self-pay | Admitting: Family

## 2017-11-11 MED ORDER — ATORVASTATIN CALCIUM 10 MG PO TABS: ORAL_TABLET | ORAL | 1 refills | Status: DC

## 2017-11-11 MED ORDER — LEVOTHYROXINE SODIUM 125 MCG PO TABS: ORAL_TABLET | ORAL | 1 refills | Status: DC

## 2017-11-11 NOTE — Telephone Encounter (Signed)
Refills are due now and Rxs have been sent to [facility]. Pharmacy contact has been changed.

## 2017-11-11 NOTE — Telephone Encounter (Signed)
Copied from [place]. Topic: Quick Communication - Rx Refill/Question >> Nov 11, 2017 11:24 AM Terry Fox, NT wrote: Medication: omeprazole (PRILOSEC) 40 MG capsule and also  verapamil (VERELAN) 100 MG 24 hr capsule  Has the patient contacted their pharmacy?  (Agent: If no, request that the patient contact the pharmacy for the refill.) Preferred Pharmacy (with phone number or street name):[facility], [place] [phone] (Phone) [phone] (Fax) Agent: Please be advised that RX refills may take up to 3 business days. We ask that you follow-up with your pharmacy.  This I new pharmacy for the patient,

## 2017-11-11 NOTE — Telephone Encounter (Signed)
Copied from [place] [id number]. Topic: General - Other >> Nov 11, 2017 11:34 AM Cecelia Byars, NT wrote: Reason for CRM: Patient would like transfer his medication to the following  mail order pharmacy [facility], [place] [phone] (Phone) [phone] (Fax) the medications are atorvastatin (LIPITOR) 10 MG tablet and also levothyroxine (SYNTHROID, LEVOTHROID) 125 MCG tablet when hie next refill is due.

## 2017-11-12 MED ORDER — VERAPAMIL HCL ER 100 MG PO CP24: ORAL_CAPSULE | ORAL | 1 refills | Status: DC

## 2017-11-12 MED ORDER — OMEPRAZOLE 40 MG PO CPDR: 40.0000 mg | DELAYED_RELEASE_CAPSULE | Freq: Every day | ORAL | 1 refills | Status: DC

## 2017-11-12 NOTE — Telephone Encounter (Signed)
omeprazole refill Last OV: 06/15/17 Last Refill:08/11/17 Pharmacy:[facility]   Verapamil refill Last OV: 10/19/15 Last Refill:08/11/17 Pharmacy:[facility]

## 2017-12-01 ENCOUNTER — Telehealth: Payer: Self-pay | Admitting: Family

## 2017-12-01 NOTE — Telephone Encounter (Signed)
Copied from [place] [id number]. Topic: Quick Communication - See Telephone Encounter >> Dec 01, 2017  2:11 PM Genella Rife H wrote: CRM for notification. See Telephone encounter for:   12/01/17.  Called and left vm to reschedule appt for 12/18/17 per pcp.

## 2017-12-18 ENCOUNTER — Encounter: Payer: PRIVATE HEALTH INSURANCE | Admitting: Family

## 2018-01-18 ENCOUNTER — Ambulatory Visit (INDEPENDENT_AMBULATORY_CARE_PROVIDER_SITE_OTHER): Payer: BLUE CROSS/BLUE SHIELD | Admitting: Family

## 2018-01-18 ENCOUNTER — Encounter: Payer: Self-pay | Admitting: Family

## 2018-01-18 VITALS — BP 121/76 | HR 65 | Temp 98.4°F | Resp 16 | Ht 72.0 in | Wt 214.0 lb

## 2018-01-18 DIAGNOSIS — M722 Plantar fascial fibromatosis: Secondary | ICD-10-CM | POA: Diagnosis not present

## 2018-01-18 DIAGNOSIS — Z0001 Encounter for general adult medical examination with abnormal findings: Secondary | ICD-10-CM | POA: Diagnosis not present

## 2018-01-18 DIAGNOSIS — Z0000 Encounter for general adult medical examination without abnormal findings: Secondary | ICD-10-CM

## 2018-01-18 MED ORDER — MELOXICAM 7.5 MG PO TABS: 7.5000 mg | ORAL_TABLET | Freq: Every day | ORAL | 0 refills | Status: DC

## 2018-01-18 NOTE — Progress Notes (Signed)
   Subjective:    Patient ID: Terry Fox, male    DOB: 12/10/1965, 52 y.o.   MRN: [id number]  HPI  Patient presents today for complete physical.  Immunizations: tetanus up to date Diet:  Reports that diet is improved, ordering from "hello [employer]" 3 times a week Exercise: reports that he is not exercising Colonoscopy: 12/31/16 Vision: 2 months ago Dental: has apt tomorrow afternoon  Reports bilateral foot pain, arches hurt when he starts to walk after sitting.  Reports that symptoms improve with walking.     Review of Systems  Constitutional: Negative for unexpected weight change.  HENT: Negative for rhinorrhea.   Eyes: Negative for visual disturbance.  Respiratory: Negative for cough.   Cardiovascular: Negative for leg swelling.  Gastrointestinal: Negative for diarrhea, nausea and vomiting.  Genitourinary: Negative for dysuria, frequency and hematuria.  Musculoskeletal: Negative for arthralgias and myalgias.  Skin: Negative for rash.  Neurological:       No recent headaches  Hematological: Negative for adenopathy.  Psychiatric/Behavioral:       Denies depression/anxiety       Objective:   Physical Exam Physical Exam  Constitutional: He is oriented to person, place, and time. He appears well-developed and well-nourished. No distress.  HENT:  Head: Normocephalic and atraumatic.  Right Ear: Tympanic membrane and ear canal normal.  Left Ear: Tympanic membrane and ear canal normal.  Mouth/Throat: Oropharynx is clear and moist.  Eyes: Pupils are equal, round, and reactive to light. No scleral icterus.  Neck: Normal range of motion. No thyromegaly present.  Cardiovascular: Normal rate and regular rhythm.   No murmur heard. Pulmonary/Chest: Effort normal and breath sounds normal. No respiratory distress. He has no wheezes. He has no rales. He exhibits no tenderness.  Abdominal: Soft. Bowel sounds are normal. He exhibits no distension and no mass. There is no tenderness.  There is no rebound and no guarding.  Musculoskeletal: He exhibits no edema.  Lymphadenopathy:    He has no cervical adenopathy.  Neurological: He is alert and oriented to person, place, and time. He has normal patellar reflexes. He exhibits normal muscle tone. Coordination normal.  Skin: Skin is warm and dry.  Psychiatric: He has a normal mood and affect. His behavior is normal. Judgment and thought content normal.           Assessment & Plan:   Preventative care- obtain routine lab work including PSA (discussed pros/cons). He would like to check measles immunity. Will check MMR titer. Colo up to date. Counseled pt on importance of regular exercise. tdap up to date.   Plantar fasciitis- mild symptoms of plantar fasciitis. Advised pt to begin meloxicam, ice feet nightly by rolling on plastic water bottle, call if symptoms do not improve.       Assessment & Plan:  EKG tracing is personally reviewed.  EKG notes NSR.  No acute changes.

## 2018-01-18 NOTE — Progress Notes (Deleted)
EKG

## 2018-01-18 NOTE — Patient Instructions (Signed)
Please complete lab work prior to leaving. Work on adding regular exercise (30 minutes 5 days a week).

## 2018-01-19 ENCOUNTER — Encounter: Payer: Self-pay | Admitting: Family

## 2018-01-19 LAB — HEPATIC FUNCTION PANEL
ALBUMIN: 4.2 g/dL (ref 3.5–5.2)
ALT: 15 U/L (ref 0–53)
AST: 16 U/L (ref 0–37)
Alkaline Phosphatase: 75 U/L (ref 39–117)
BILIRUBIN TOTAL: 1.4 mg/dL — AB (ref 0.2–1.2)
Bilirubin, Direct: 0.2 mg/dL (ref 0.0–0.3)
Total Protein: 7.2 g/dL (ref 6.0–8.3)

## 2018-01-19 LAB — URINALYSIS, ROUTINE W REFLEX MICROSCOPIC
Bilirubin Urine: NEGATIVE
HGB URINE DIPSTICK: NEGATIVE
LEUKOCYTES UA: NEGATIVE
NITRITE: NEGATIVE
SPECIFIC GRAVITY, URINE: 1.015 (ref 1.000–1.030)
Total Protein, Urine: NEGATIVE
Urine Glucose: NEGATIVE
Urobilinogen, UA: 1 (ref 0.0–1.0)
pH: 7 (ref 5.0–8.0)

## 2018-01-19 LAB — CBC WITH DIFFERENTIAL/PLATELET
Basophils Absolute: 0.1 10*3/uL (ref 0.0–0.1)
Basophils Relative: 1 % (ref 0.0–3.0)
EOS PCT: 2.2 % (ref 0.0–5.0)
Eosinophils Absolute: 0.1 10*3/uL (ref 0.0–0.7)
HCT: 40.2 % (ref 39.0–52.0)
Hemoglobin: 13.6 g/dL (ref 13.0–17.0)
LYMPHS ABS: 1.8 10*3/uL (ref 0.7–4.0)
Lymphocytes Relative: 30.9 % (ref 12.0–46.0)
MCHC: 33.8 g/dL (ref 30.0–36.0)
MCV: 92 fl (ref 78.0–100.0)
Monocytes Absolute: 0.6 10*3/uL (ref 0.1–1.0)
Monocytes Relative: 10.4 % (ref 3.0–12.0)
NEUTROS ABS: 3.3 10*3/uL (ref 1.4–7.7)
NEUTROS PCT: 55.5 % (ref 43.0–77.0)
PLATELETS: 187 10*3/uL (ref 150.0–400.0)
RBC: 4.36 Mil/uL (ref 4.22–5.81)
RDW: 13.5 % (ref 11.5–15.5)
WBC: 5.9 10*3/uL (ref 4.0–10.5)

## 2018-01-19 LAB — LIPID PANEL
CHOL/HDL RATIO: 4
CHOLESTEROL: 129 mg/dL (ref 0–200)
HDL: 31.2 mg/dL — ABNORMAL LOW (ref >39.00)
NonHDL: 98.12
TRIGLYCERIDES: 248 mg/dL — AB (ref 0.0–149.0)
VLDL: 49.6 mg/dL — AB (ref 0.0–40.0)

## 2018-01-19 LAB — PSA: PSA: 0.36 ng/mL (ref 0.10–4.00)

## 2018-01-19 LAB — BASIC METABOLIC PANEL
BUN: 17 mg/dL (ref 6–23)
CHLORIDE: 106 meq/L (ref 96–112)
CO2: 31 mEq/L (ref 19–32)
Calcium: 9.4 mg/dL (ref 8.4–10.5)
Creatinine, Ser: 1.25 mg/dL (ref 0.40–1.50)
GFR: 64.55 mL/min (ref >60.00)
Glucose, Bld: 95 mg/dL (ref 70–99)
POTASSIUM: 4.8 meq/L (ref 3.5–5.1)
SODIUM: 142 meq/L (ref 135–145)

## 2018-01-19 LAB — TSH: TSH: 1.39 u[IU]/mL (ref 0.35–4.50)

## 2018-01-19 LAB — LDL CHOLESTEROL, DIRECT: LDL DIRECT: 76 mg/dL

## 2018-01-20 LAB — MEASLES/MUMPS/RUBELLA IMMUNITY
MUMPS IGG: 96.9 [AU]/ml
RUBELLA: 24.6 {index}
Rubeola IgG: 55.4 AU/mL

## 2018-02-02 ENCOUNTER — Encounter: Payer: Self-pay | Admitting: Family
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
## 2018-05-20 ENCOUNTER — Other Ambulatory Visit: Payer: Self-pay | Admitting: Family

## 2018-05-26 ENCOUNTER — Other Ambulatory Visit: Payer: Self-pay | Admitting: Family

## 2018-05-27 ENCOUNTER — Encounter: Payer: Self-pay | Admitting: Family

## 2018-05-28 ENCOUNTER — Encounter: Payer: Self-pay | Admitting: Family

## 2018-05-28 ENCOUNTER — Ambulatory Visit: Payer: BLUE CROSS/BLUE SHIELD | Admitting: Family

## 2018-05-28 VITALS — BP 118/80 | HR 65 | Temp 97.9°F | Resp 18 | Ht 72.0 in | Wt 217.4 lb

## 2018-05-28 DIAGNOSIS — G43909 Migraine, unspecified, not intractable, without status migrainosus: Secondary | ICD-10-CM | POA: Diagnosis not present

## 2018-05-28 MED ORDER — AMITRIPTYLINE HCL 25 MG PO TABS: 25.0000 mg | ORAL_TABLET | Freq: Every day | ORAL | 2 refills | Status: DC

## 2018-05-28 MED ORDER — AMITRIPTYLINE HCL 25 MG PO TABS: 25.0000 mg | ORAL_TABLET | Freq: Every day | ORAL | 0 refills | Status: DC

## 2018-05-28 MED ORDER — RIZATRIPTAN BENZOATE 10 MG PO TABS: ORAL_TABLET | ORAL | 1 refills | Status: DC

## 2018-05-28 MED ORDER — KETOROLAC TROMETHAMINE 60 MG/2ML IM SOLN
60.0000 mg | Freq: Once | INTRAMUSCULAR | Status: AC
  Administered 2018-05-28: 60 mg via INTRAMUSCULAR

## 2018-05-28 MED ORDER — RIZATRIPTAN BENZOATE 10 MG PO TABS: ORAL_TABLET | ORAL | 3 refills | Status: DC

## 2018-05-28 NOTE — Addendum Note (Signed)
Addended by: Kelle Darting A on: 05/28/2018 04:05 PM   Modules accepted: Orders

## 2018-05-28 NOTE — Patient Instructions (Signed)
Stop Goody powder. You may use maxalt as needed for migraine. Begin elavil once daily in the evenings, for migraine prevention.  You should be contacted about your referral to neurology.  Call if headache worsens or fails to improve with above measures.

## 2018-05-28 NOTE — Progress Notes (Signed)
Subjective:    Patient ID: Terry Fox, male    DOB: May 03, 1966, 52 y.o.   MRN: [id number]  HPI  Patient is a 52 yr old male with hx of migraine who presents today with chief complaint of headache. Reports that he has had a HA x 2 weeks. Using goody powders (used 4 yesterday) without much relief. Reports that he has not tried imitrex because it makes him too tired.  HA is above the left eye but has been in various places over the last 2 week.  Denies associated nausea.  Denies any associated visual changes. + photophobia. Todays HA is 4/10.       Review of Systems    see HPI  Past Medical History:  Diagnosis Date  . Allergy   . Arthritis   . Depression    in his 50's  . Generalized headaches   . GERD (gastroesophageal reflux disease)   . Hernia   . Thyroid disease    hypothyroidism     Social History   Socioeconomic History  . Marital status: Married    Spouse name: Not on file  . Number of children: 6  . Years of education: Not on file  . Highest education level: Not on file  Occupational History  . Occupation: Retired Civil engineer, contracting  . Financial resource strain: Not on file  . Food insecurity:    Worry: Not on file    Inability: Not on file  . Transportation needs:    Medical: Not on file    Non-medical: Not on file  Tobacco Use  . Smoking status: Former Smoker    Types: E-cigarettes    Last attempt to quit: 10/11/1996    Years since quitting: 21.6  . Smokeless tobacco: Former Systems developer    Types: Chew  . Tobacco comment: Pt currenty vapes (3mg  nicotine)  Substance and Sexual Activity  . Alcohol use: Yes    Alcohol/week: 12.0 standard drinks    Types: 12 Cans of beer per week    Comment: 6-8 beers weekly  . Drug use: No  . Sexual activity: Yes  Lifestyle  . Physical activity:    Days per week: Not on file    Minutes per session: Not on file  . Stress: Not on file  Relationships  . Social connections:    Talks on phone: Not on file    Gets  together: Not on file    Attends religious service: Not on file    Active member of club or organization: Not on file    Attends meetings of clubs or organizations: Not on file    Relationship status: Not on file  . Intimate partner violence:    Fear of current or ex partner: Not on file    Emotionally abused: Not on file    Physically abused: Not on file    Forced sexual activity: Not on file  Other Topics Concern  . Not on file  Social History Narrative   Regular exercise:  No   Caffeine Use:  Rarely   Works as a Theatre stage manager.   Smokeless tobacco   Married   2 biological children   4 step children             Past Surgical History:  Procedure Laterality Date  . HERNIA REPAIR  11/21/15   umbilical hernia repair-- [facility]   . TONSILLECTOMY  1980's    Family History  Problem Relation Age of Onset  .  Arthritis Father   . Hyperlipidemia Mother   . Heart attack Neg Hx   . Hypertension Neg Hx   . Sudden death Neg Hx   . Colon cancer Neg Hx   . Esophageal cancer Neg Hx   . Pancreatic cancer Neg Hx   . Prostate cancer Neg Hx   . Rectal cancer Neg Hx   . Stomach cancer Neg Hx     No Known Allergies  Current Outpatient Medications on File Prior to Visit  Medication Sig Dispense Refill  . Aspirin-Acetaminophen-Caffeine (GOODY HEADACHE PO) Take by mouth 4 (four) times daily as needed.     Marland Kitchen atorvastatin (LIPITOR) 10 MG tablet TAKE 1 TABLET  (10MG  TOTAL) BY MOUTH DAILY 90 tablet 1  . levothyroxine (SYNTHROID, LEVOTHROID) 125 MCG tablet TAKE 1 TABLET BY MOUTH  DAILY 90 tablet 1  . omeprazole (PRILOSEC) 40 MG capsule Take 1 capsule (40 mg total) by mouth daily. 90 capsule 1  . verapamil (VERELAN) 100 MG 24 hr capsule TAKE 1 CAPSULE (100 MG) BY MOUTH AT BEDTIME. 90 capsule 1   Current Facility-Administered Medications on File Prior to Visit  Medication Dose Route Frequency Provider Last Rate Last Dose  . 0.9 %  sodium chloride infusion  500 mL Intravenous Continuous  Armbruster, Carlota Raspberry, MD        BP 118/80 (BP Location: Right Arm, Cuff Size: Large)   Pulse 65   Temp 97.9 F (36.6 C) (Oral)   Resp 18   Ht 6' (1.829 m)   Wt 217 lb 6.4 oz (98.6 kg)   SpO2 99%   BMI 29.48 kg/m    Objective:   Physical Exam  Constitutional: He is oriented to person, place, and time. He appears well-developed and well-nourished. No distress.  HENT:  Head: Normocephalic and atraumatic.  Eyes: Pupils are equal, round, and reactive to light. EOM are normal.  Cardiovascular: Normal rate and regular rhythm.  No murmur heard. Pulmonary/Chest: Effort normal and breath sounds normal. No respiratory distress. He has no wheezes. He has no rales.  Musculoskeletal: He exhibits no edema.  Neurological: He is alert and oriented to person, place, and time. No cranial nerve deficit. He exhibits normal muscle tone.  Skin: Skin is warm and dry.  Psychiatric: He has a normal mood and affect. His behavior is normal. Thought content normal.          Assessment & Plan:  Migraine- uncontrolled. Advised pt to d/c goody powder. Add elavil for migraine prophylaxis. D/c imitrex and instead trial of maxalt to see if he can better tolerate. Will give toradol 60mg  IM in the office today and also place a referral to neurology for consultation.

## 2018-06-01 NOTE — Progress Notes (Signed)
NEUROLOGY CONSULTATION NOTE  Terry Fox MRN: [id number] DOB: 15-May-1966  Referring provider: [name], NP Primary care provider: Ashby Dawes  Reason for consult:  headaches  HISTORY OF PRESENT ILLNESS: Terry Fox is a 52 year old right-handed male with hypothyroidism and depression who presents for headaches.  History supplemented by referring provider's note.  Onset:  30s.   Location:  Left frontal region.  It has moved around. Quality:  pressure Intensity:  Mild to moderate to severe.  He denies new headache, thunderclap headache or severe headache that wakes him from sleep. Aura:  no Prodrome:  no Postdrome:  no Associated symptoms:  photophobia.  He denies associated nausea, vomiting, phonophobia, autonomic symptoms, visual disturbance unilateral numbness or weakness. Duration:  Within 20 minutes with Goody powder (he always takes something so unknown how long it lasts without treatment).  For past 2 weeks, they were daily and intractable until the past couple of days. Frequency:  2 days a week for past 2 weeks but maybe 1 time a month prior to that. Frequency of abortive medication: Only when has headache Triggers:  No Exacerbating factors:  Light Relieving factors:  Rest in dark room Activity:  Daily activity does not effect it.  Current NSAIDS:  no Current analgesics:  Goody powder whenever he has a headache Current triptans:  Prescribed Maxalt 10mg .  Not yet started. Current ergotamine:  no Current anti-emetic:  no Current muscle relaxants:  no Current anti-anxiolytic:  no Current sleep aide:  no Current Antihypertensive medications:  Verapamil 24h 100mg  at bedtime Current Antidepressant medications:  Prescribed amitriptyline 25mg .  Not yet started. Current Anticonvulsant medications:  no Current anti-CGRP:  no Current Vitamins/Herbal/Supplements:  no Current Antihistamines/Decongestants:  no Other therapy:  no Other medication:   levothyroxine  Past NSAIDS:  ASA, ibuprofen, Aleve Past analgesics:  Tylenol, Excedrin Past abortive triptans:  Sumatriptan 50mg  (wiped him out) Past abortive ergotamine:  no Past muscle relaxants:  no Past anti-emetic:  no Past antihypertensive medications:  no Past antidepressant medications:  Wellbutrin (for depression) Past anticonvulsant medications:  topiramate 25mg  twice daily (listed on past medication list but does not remember taking it) Past anti-CGRP:  no Past vitamins/Herbal/Supplements:  no Past antihistamines/decongestants:  no Other past therapies:  no  Caffeine:  Stopped coffee or other caffeine 3 months ago. Alcohol:  6 to 9 beers in a week Smoker:  vape Diet:  5-6 bottles water daily Exercise:  Not routine Depression:  no; Anxiety:  no Other pain:  knees Sleep hygiene: good. Family history of headache:  No  Head CT from 02/13/09 personally reviewed and was unremarkable. 01/18/18 LABS:  Na 142, K 4.8, CL 106, CO2 31, glucose 95, BUN 17, Cr 1.25, t bili 1.4, ALP 75, AST 16, ALT 15.  PAST MEDICAL HISTORY: Past Medical History:  Diagnosis Date  . Allergy   . Arthritis   . Depression    in his 42's  . Generalized headaches   . GERD (gastroesophageal reflux disease)   . Hernia   . Thyroid disease    hypothyroidism    PAST SURGICAL HISTORY: Past Surgical History:  Procedure Laterality Date  . HERNIA REPAIR  05/18/80   umbilical hernia repair-- [facility]   . TONSILLECTOMY  1980's    MEDICATIONS: Current Outpatient Medications on File Prior to Visit  Medication Sig Dispense Refill  . amitriptyline (ELAVIL) 25 MG tablet Take 1 tablet (25 mg total) by mouth at bedtime. 90 tablet 0  . atorvastatin (LIPITOR)  10 MG tablet TAKE 1 TABLET BY MOUTH  DAILY 90 tablet 1  . levothyroxine (SYNTHROID, LEVOTHROID) 125 MCG tablet TAKE 1 TABLET BY MOUTH  DAILY 90 tablet 1  . omeprazole (PRILOSEC) 40 MG capsule TAKE 1 CAPSULE BY MOUTH  DAILY 90 capsule 1  . rizatriptan (MAXALT)  10 MG tablet 1 tab by mouth at start of migraine, may repeat in 2 hrs if not resolved (max 2 tabs/24 hrs) 30 tablet 1  . verapamil (VERELAN) 100 MG 24 hr capsule TAKE 1 CAPSULE BY MOUTH AT  BEDTIME 90 capsule 1   Current Facility-Administered Medications on File Prior to Visit  Medication Dose Route Frequency Provider Last Rate Last Dose  . 0.9 %  sodium chloride infusion  500 mL Intravenous Continuous Armbruster, Carlota Raspberry, MD        ALLERGIES: No Known Allergies  FAMILY HISTORY: Family History  Problem Relation Age of Onset  . Arthritis Father   . Hyperlipidemia Mother   . Heart attack Neg Hx   . Hypertension Neg Hx   . Sudden death Neg Hx   . Colon cancer Neg Hx   . Esophageal cancer Neg Hx   . Pancreatic cancer Neg Hx   . Prostate cancer Neg Hx   . Rectal cancer Neg Hx   . Stomach cancer Neg Hx    SOCIAL HISTORY: Social History   Socioeconomic History  . Marital status: Married    Spouse name: Not on file  . Number of children: 6  . Years of education: Not on file  . Highest education level: Not on file  Occupational History  . Occupation: Retired Civil engineer, contracting  . Financial resource strain: Not on file  . Food insecurity:    Worry: Not on file    Inability: Not on file  . Transportation needs:    Medical: Not on file    Non-medical: Not on file  Tobacco Use  . Smoking status: Former Smoker    Types: E-cigarettes    Last attempt to quit: 10/11/1996    Years since quitting: 21.6  . Smokeless tobacco: Former Systems developer    Types: Chew  . Tobacco comment: Pt currenty vapes (3mg  nicotine)  Substance and Sexual Activity  . Alcohol use: Yes    Alcohol/week: 12.0 standard drinks    Types: 12 Cans of beer per week    Comment: 6-8 beers weekly  . Drug use: No  . Sexual activity: Yes  Lifestyle  . Physical activity:    Days per week: Not on file    Minutes per session: Not on file  . Stress: Not on file  Relationships  . Social connections:    Talks on phone:  Not on file    Gets together: Not on file    Attends religious service: Not on file    Active member of club or organization: Not on file    Attends meetings of clubs or organizations: Not on file    Relationship status: Not on file  . Intimate partner violence:    Fear of current or ex partner: Not on file    Emotionally abused: Not on file    Physically abused: Not on file    Forced sexual activity: Not on file  Other Topics Concern  . Not on file  Social History Narrative   Regular exercise:  No   Caffeine Use:  Rarely   Works as a Theatre stage manager.   Smokeless tobacco   Married  2 biological children   4 step children             REVIEW OF SYSTEMS: Constitutional: No fevers, chills, or sweats, no generalized fatigue, change in appetite Eyes: No visual changes, double vision, eye pain Ear, nose and throat: No hearing loss, ear pain, nasal congestion, sore throat Cardiovascular: No chest pain, palpitations Respiratory:  No shortness of breath at rest or with exertion, wheezes GastrointestinaI: No nausea, vomiting, diarrhea, abdominal pain, fecal incontinence Genitourinary:  No dysuria, urinary retention or frequency Musculoskeletal:  No neck pain, back pain Integumentary: No rash, pruritus, skin lesions Neurological: as above Psychiatric: No depression, insomnia, anxiety Endocrine: No palpitations, fatigue, diaphoresis, mood swings, change in appetite, change in weight, increased thirst Hematologic/Lymphatic:  No purpura, petechiae. Allergic/Immunologic: no itchy/runny eyes, nasal congestion, recent allergic reactions, rashes  PHYSICAL EXAM: Blood pressure 116/82, pulse 68, height 6' (1.829 m), weight 212 lb (96.2 kg), SpO2 98 %. General: No acute distress.  Patient appears well-groomed.   Head:  Normocephalic/atraumatic Eyes:  fundi examined but not visualized Neck: supple, no paraspinal tenderness, full range of motion Back: No paraspinal tenderness Heart: regular  rate and rhythm Lungs: Clear to auscultation bilaterally. Vascular: No carotid bruits. Neurological Exam: Mental status: alert and oriented to person, place, and time, recent and remote memory intact, fund of knowledge intact, attention and concentration intact, speech fluent and not dysarthric, language intact. Cranial nerves: CN I: not tested CN II: pupils equal, round and reactive to light, visual fields intact CN III, IV, VI:  full range of motion, no nystagmus, no ptosis CN V: facial sensation intact CN VII: upper and lower face symmetric CN VIII: hearing intact CN IX, X: gag intact, uvula midline CN XI: sternocleidomastoid and trapezius muscles intact CN XII: tongue midline Bulk & Tone: normal, no fasciculations. Motor:  5/5 throughout  Sensation:  temperature and vibration sensation intact.   Deep Tendon Reflexes:  2+ throughout, toes downgoing.   Finger to nose testing:  Without dysmetria.   Heel to shin:  Without dysmetria.   Gait:  Normal station and stride.  Able to turn and tandem walk. Romberg negative  IMPRESSION: Tension-type headache, not intractable  PLAN: 1.  Due to better tolerance, we will start him on nortriptyline 25mg  at bedtime instead of amitriptyline.   If headaches not improved in 4 weeks, contact me and we can increase dose. 2.  Stop Goody.  For abortive therapy, he will take flurbiprofen 100mg .  May take every 8 hours as needed, no more than 300mg  in 24 hours.  As I don't suspect that these are migraines at this time, I will have him hold off on rizatriptan. 3.   Limit use of pain relievers to no more than 2 days out of week to prevent risk of rebound or medication-overuse headache. 4.  Keep headache diary 5.  Follow up in 3 to 4 months.  Thank you for allowing me to take part in the care of this patient.  Metta Clines, DO  CC: [name], NP

## 2018-06-03 ENCOUNTER — Ambulatory Visit: Payer: BLUE CROSS/BLUE SHIELD | Admitting: Neurology

## 2018-06-03 ENCOUNTER — Encounter: Payer: Self-pay | Admitting: Neurology

## 2018-06-03 VITALS — BP 116/82 | HR 68 | Ht 72.0 in | Wt 212.0 lb

## 2018-06-03 DIAGNOSIS — G44219 Episodic tension-type headache, not intractable: Secondary | ICD-10-CM | POA: Diagnosis not present

## 2018-06-03 MED ORDER — NORTRIPTYLINE HCL 25 MG PO CAPS: 25.0000 mg | ORAL_CAPSULE | Freq: Every day | ORAL | 3 refills | Status: DC

## 2018-06-03 MED ORDER — FLURBIPROFEN 100 MG PO TABS: ORAL_TABLET | ORAL | 3 refills | Status: DC

## 2018-06-03 NOTE — Patient Instructions (Addendum)
1.  Take Nortriptyline 25mg  at bedtime.  If headaches not improved in 4 weeks, contact me and we can increase dose. 2.  Stop Goody.  When you get a headache, take flurbiprofen 100mg .  May take every 8 hours as needed, no more than 300mg  in 24 hours. 3.   Limit use of pain relievers to no more than 2 days out of week to prevent risk of rebound or medication-overuse headache. 4.  Keep headache diary 5.  Follow up in 3 to 4 months.

## 2018-07-19 ENCOUNTER — Ambulatory Visit: Payer: BLUE CROSS/BLUE SHIELD | Admitting: Family

## 2018-07-30 ENCOUNTER — Ambulatory Visit: Payer: BLUE CROSS/BLUE SHIELD | Admitting: Family

## 2018-07-30 VITALS — BP 122/70 | HR 84 | Temp 98.1°F | Resp 16 | Ht 72.0 in | Wt 219.4 lb

## 2018-07-30 DIAGNOSIS — E039 Hypothyroidism, unspecified: Secondary | ICD-10-CM | POA: Diagnosis not present

## 2018-07-30 DIAGNOSIS — E785 Hyperlipidemia, unspecified: Secondary | ICD-10-CM

## 2018-07-30 DIAGNOSIS — K219 Gastro-esophageal reflux disease without esophagitis: Secondary | ICD-10-CM

## 2018-07-30 DIAGNOSIS — G43909 Migraine, unspecified, not intractable, without status migrainosus: Secondary | ICD-10-CM

## 2018-07-30 NOTE — Progress Notes (Signed)
Subjective:    Patient ID: Terry Fox, male    DOB: [birth year], 52 y.o.   MRN: [id number]  HPI   Patient is a 52 yr old male who presents today for follow up.   Hypothyroid- feels well on current dose of synthroid.  Lab Results  Component Value Date   TSH 1.39 01/18/2018   Hyperlipidemia- maintained on atorvastatin.  Lab Results  Component Value Date   CHOL 129 01/18/2018   HDL 31.20 (L) 01/18/2018   LDLCALC 66 10/31/2016   LDLDIRECT 76.0 01/18/2018   TRIG 248.0 (H) 01/18/2018   CHOLHDL 4 01/18/2018   Migraines- stable.  Seeing Dr. Tomi Likens. He placed him on pamelor.   GERD- stable on PPI.  Symptoms are generally well controlled.      Review of Systems See HPI  Past Medical History:  Diagnosis Date  . Allergy   . Arthritis   . Depression    in his 90's  . Generalized headaches   . GERD (gastroesophageal reflux disease)   . Hernia   . Thyroid disease    hypothyroidism     Social History   Socioeconomic History  . Marital status: Married    Spouse name: [name]  . Number of children: 6  . Years of education: Not on file  . Highest education level: Associate degree: academic program  Occupational History  . Occupation: Retired Civil engineer, contracting  . Financial resource strain: Not on file  . Food insecurity:    Worry: Not on file    Inability: Not on file  . Transportation needs:    Medical: Not on file    Non-medical: Not on file  Tobacco Use  . Smoking status: Former Smoker    Types: E-cigarettes    Last attempt to quit: 10/11/1996    Years since quitting: 21.8  . Smokeless tobacco: Former Systems developer    Types: Chew  . Tobacco comment: Pt currenty vapes (3mg  nicotine)  Substance and Sexual Activity  . Alcohol use: Yes    Alcohol/week: 12.0 standard drinks    Types: 12 Cans of beer per week    Comment: 6-8 beers weekly  . Drug use: No  . Sexual activity: Yes  Lifestyle  . Physical activity:    Days per week: Not on file    Minutes per  session: Not on file  . Stress: Not on file  Relationships  . Social connections:    Talks on phone: Not on file    Gets together: Not on file    Attends religious service: Not on file    Active member of club or organization: Not on file    Attends meetings of clubs or organizations: Not on file    Relationship status: Not on file  . Intimate partner violence:    Fear of current or ex partner: Not on file    Emotionally abused: Not on file    Physically abused: Not on file    Forced sexual activity: Not on file  Other Topics Concern  . Not on file  Social History Narrative   Regular exercise:  No   Caffeine Use:  Rarely   Works as a Theatre stage manager.   Smokeless tobacco   Married   2 biological children   4 step children      Patient is right-handed. He lives with his wife in a one level house. He has been avoiding caffeine for 4-5 months. He does not exercise.  Past Surgical History:  Procedure Laterality Date  . HERNIA REPAIR  02/17/77   umbilical hernia repair-- [facility]   . TONSILLECTOMY  1980's    Family History  Problem Relation Age of Onset  . Arthritis Father   . Hyperlipidemia Mother   . Peripheral vascular disease Mother   . Heart attack Neg Hx   . Hypertension Neg Hx   . Sudden death Neg Hx   . Colon cancer Neg Hx   . Esophageal cancer Neg Hx   . Pancreatic cancer Neg Hx   . Prostate cancer Neg Hx   . Rectal cancer Neg Hx   . Stomach cancer Neg Hx     No Known Allergies  Current Outpatient Medications on File Prior to Visit  Medication Sig Dispense Refill  . atorvastatin (LIPITOR) 10 MG tablet TAKE 1 TABLET BY MOUTH  DAILY 90 tablet 1  . flurbiprofen (ANSAID) 100 MG tablet Take 1 tablet every 8 hours as needed.  Maximum 3 tablets in 24h 24 tablet 3  . levothyroxine (SYNTHROID, LEVOTHROID) 125 MCG tablet TAKE 1 TABLET BY MOUTH  DAILY 90 tablet 1  . nortriptyline (PAMELOR) 25 MG capsule Take 1 capsule (25 mg total) by mouth at bedtime. 30 capsule 3    . omeprazole (PRILOSEC) 40 MG capsule TAKE 1 CAPSULE BY MOUTH  DAILY 90 capsule 1  . rizatriptan (MAXALT) 10 MG tablet 1 tab by mouth at start of migraine, may repeat in 2 hrs if not resolved (max 2 tabs/24 hrs) 30 tablet 1  . verapamil (VERELAN) 100 MG 24 hr capsule TAKE 1 CAPSULE BY MOUTH AT  BEDTIME 90 capsule 1   Current Facility-Administered Medications on File Prior to Visit  Medication Dose Route Frequency Provider Last Rate Last Dose  . 0.9 %  sodium chloride infusion  500 mL Intravenous Continuous Armbruster, Carlota Raspberry, MD        BP 122/70 (BP Location: Right Arm, Patient Position: Sitting, Cuff Size: Normal)   Pulse 84   Temp 98.1 F (36.7 C) (Oral)   Resp 16   Ht 6' (1.829 m)   Wt 219 lb 6.4 oz (99.5 kg)   SpO2 97%   BMI 29.76 kg/m       Objective:   Physical Exam  Constitutional: He is oriented to person, place, and time. He appears well-developed and well-nourished. No distress.  HENT:  Head: Normocephalic and atraumatic.  Cardiovascular: Normal rate and regular rhythm.  No murmur heard. Pulmonary/Chest: Effort normal and breath sounds normal. No respiratory distress. He has no wheezes. He has no rales.  Musculoskeletal: He exhibits no edema.  Neurological: He is alert and oriented to person, place, and time.  Skin: Skin is warm and dry.  Psychiatric: He has a normal mood and affect. His behavior is normal. Thought content normal.          Assessment & Plan:  Migraines- stable, management per neurology.  Hypothyroid- clinically stable on synthroid, continue same, obtain follow up lipid panel.  Hyperlipidemia- tolerating statin, obtain follow up lipid panel to reassess elevated triglycerides.  GERD- stable on PPI, continue same.

## 2018-08-02 ENCOUNTER — Other Ambulatory Visit: Payer: BLUE CROSS/BLUE SHIELD

## 2018-08-04 ENCOUNTER — Other Ambulatory Visit: Payer: Self-pay | Admitting: Family

## 2018-08-04 ENCOUNTER — Telehealth: Payer: Self-pay | Admitting: Family

## 2018-08-04 DIAGNOSIS — E785 Hyperlipidemia, unspecified: Secondary | ICD-10-CM

## 2018-08-04 DIAGNOSIS — E039 Hypothyroidism, unspecified: Secondary | ICD-10-CM

## 2018-08-04 NOTE — Telephone Encounter (Signed)
Pt needs to complete lab work please ordered during his office visit.

## 2018-08-06 NOTE — Telephone Encounter (Signed)
Pt has been sch for labs on 08-09-18

## 2018-08-06 NOTE — Telephone Encounter (Signed)
Left detailed message on pt's voicemail to call and schedule lab appt. Labs have been cancelled and re-ordered as future. [place] for [facility] / Triage to discuss and schedule for pt.

## 2018-08-09 ENCOUNTER — Encounter: Payer: Self-pay | Admitting: Family

## 2018-08-09 ENCOUNTER — Other Ambulatory Visit (INDEPENDENT_AMBULATORY_CARE_PROVIDER_SITE_OTHER): Payer: BLUE CROSS/BLUE SHIELD

## 2018-08-09 DIAGNOSIS — E039 Hypothyroidism, unspecified: Secondary | ICD-10-CM

## 2018-08-09 DIAGNOSIS — E785 Hyperlipidemia, unspecified: Secondary | ICD-10-CM

## 2018-08-09 LAB — LIPID PANEL
CHOL/HDL RATIO: 5
Cholesterol: 137 mg/dL (ref 0–200)
HDL: 28.5 mg/dL — ABNORMAL LOW (ref >39.00)
NONHDL: 108.43
Triglycerides: 326 mg/dL — ABNORMAL HIGH (ref 0.0–149.0)
VLDL: 65.2 mg/dL — AB (ref 0.0–40.0)

## 2018-08-09 LAB — COMPREHENSIVE METABOLIC PANEL
ALT: 26 U/L (ref 0–53)
AST: 20 U/L (ref 0–37)
Albumin: 4.1 g/dL (ref 3.5–5.2)
Alkaline Phosphatase: 88 U/L (ref 39–117)
BUN: 19 mg/dL (ref 6–23)
CO2: 28 meq/L (ref 19–32)
CREATININE: 1.42 mg/dL (ref 0.40–1.50)
Calcium: 9.5 mg/dL (ref 8.4–10.5)
Chloride: 105 mEq/L (ref 96–112)
GFR: 55.6 mL/min — ABNORMAL LOW (ref >60.00)
GLUCOSE: 107 mg/dL — AB (ref 70–99)
Potassium: 5 mEq/L (ref 3.5–5.1)
SODIUM: 140 meq/L (ref 135–145)
Total Bilirubin: 1.2 mg/dL (ref 0.2–1.2)
Total Protein: 6.8 g/dL (ref 6.0–8.3)

## 2018-08-09 LAB — TSH: TSH: 3.91 u[IU]/mL (ref 0.35–4.50)

## 2018-08-09 LAB — LDL CHOLESTEROL, DIRECT: LDL DIRECT: 84 mg/dL

## 2018-09-01 ENCOUNTER — Encounter: Payer: Self-pay | Admitting: Family

## 2018-09-16 NOTE — Progress Notes (Signed)
NEUROLOGY FOLLOW UP OFFICE NOTE  Terry Fox [id number]  HISTORY OF PRESENT ILLNESS: Terry Fox is a 53 year old right-handed male with hypothyroidism and depression who follows up for tension type headache.  UPDATE: While on nortriptyline, he was having daily moderate headaches beginning at [facility].  He would take a Goody powder and would resolve within 2 hours.  He stopped taking it about a week ago and has not had further headaches.  He didn't take the flurbiprofen because he didn't remember what it was for. Current NSAIDS: none Current analgesics: Goody Current triptans: None Current ergotamine: None Current anti-emetic: None Current muscle relaxants: None Current anti-anxiolytic: None Current sleep aide: None Current Antihypertensive medications: Verapamil 24H 100 mg Current Antidepressant medications: Nortriptyline 25 mg at bedtime Current Anticonvulsant medications: None Current anti-CGRP: None Current Vitamins/Herbal/Supplements: None Current Antihistamines/Decongestants: None Other therapy: None Other medication: Levothyroxine  Caffeine: None Alcohol: 6-9 beers a week Smoker: Vape Diet: 5-6 16oz bottles of water daily Exercise: Not routine Depression: No; Anxiety: No Other pain: Knees Sleep hygiene: Good  HISTORY:  Onset: 30s   Location:  Left frontal region.  It has moved around. Quality:  pressure Initial intensity:  Mild to moderate to severe.  He denies new headache, thunderclap headache or severe headache that wakes him from sleep. Aura:  no Prodrome:  no Postdrome:  no Associated symptoms: Photophobia.  He denies associated nausea, vomiting, phonophobia, autonomic symptoms, visual disturbance unilateral numbness or weakness. Initial duration:  Within 20 minutes with Goody powder (he always takes something so unknown how long it lasts without treatment).  For past 2 weeks, they were daily and intractable until the past couple of days. Initial  Frequency:  2 days a week for past 2 weeks but maybe 1 time a month prior to that. Initial Frequency of abortive medication: Only when has headache Triggers: No Exacerbating factors:  Light Relieving factors:  Rest in dark room Activity:  Daily activity does not effect it.  Past NSAIDS:  ASA, ibuprofen, Aleve Past analgesics:  Tylenol, Excedrin, Goody powder Past abortive triptans:  Sumatriptan 50mg  (wiped him out) Past abortive ergotamine:  no Past muscle relaxants:  no Past anti-emetic:  no Past antihypertensive medications:  no Past antidepressant medications:  Wellbutrin (for depression) Past anticonvulsant medications:  topiramate 25mg  twice daily (listed on past medication list but does not remember taking it) Past anti-CGRP:  no Past vitamins/Herbal/Supplements:  no Past antihistamines/decongestants:  no Other past therapies:  no  Family history of headache:  No  Head CT from 02/13/09 personally reviewed and was unremarkable.  PAST MEDICAL HISTORY: Past Medical History:  Diagnosis Date  . Allergy   . Arthritis   . Depression    in his 39's  . Generalized headaches   . GERD (gastroesophageal reflux disease)   . Hernia   . Thyroid disease    hypothyroidism    MEDICATIONS: Current Outpatient Medications on File Prior to Visit  Medication Sig Dispense Refill  . atorvastatin (LIPITOR) 10 MG tablet TAKE 1 TABLET BY MOUTH  DAILY 90 tablet 1  . atorvastatin (LIPITOR) 10 MG tablet TAKE 1 TABLET(10 MG) BY MOUTH DAILY 90 tablet 1  . flurbiprofen (ANSAID) 100 MG tablet Take 1 tablet every 8 hours as needed.  Maximum 3 tablets in 24h 24 tablet 3  . levothyroxine (SYNTHROID, LEVOTHROID) 125 MCG tablet TAKE 1 TABLET BY MOUTH  DAILY 90 tablet 1  . nortriptyline (PAMELOR) 25 MG capsule Take 1 capsule (25 mg total) by mouth  at bedtime. 30 capsule 3  . omeprazole (PRILOSEC) 40 MG capsule TAKE 1 CAPSULE BY MOUTH  DAILY 90 capsule 1  . rizatriptan (MAXALT) 10 MG tablet 1 tab by mouth  at start of migraine, may repeat in 2 hrs if not resolved (max 2 tabs/24 hrs) 30 tablet 1  . verapamil (VERELAN) 100 MG 24 hr capsule TAKE 1 CAPSULE BY MOUTH AT  BEDTIME 90 capsule 1   Current Facility-Administered Medications on File Prior to Visit  Medication Dose Route Frequency Provider Last Rate Last Dose  . 0.9 %  sodium chloride infusion  500 mL Intravenous Continuous Armbruster, Carlota Raspberry, MD        ALLERGIES: No Known Allergies  FAMILY HISTORY: Family History  Problem Relation Age of Onset  . Arthritis Father   . Hyperlipidemia Mother   . Peripheral vascular disease Mother   . Heart attack Neg Hx   . Hypertension Neg Hx   . Sudden death Neg Hx   . Colon cancer Neg Hx   . Esophageal cancer Neg Hx   . Pancreatic cancer Neg Hx   . Prostate cancer Neg Hx   . Rectal cancer Neg Hx   . Stomach cancer Neg Hx    SOCIAL HISTORY: Social History   Socioeconomic History  . Marital status: Married    Spouse name: Terry Fox  . Number of children: 6  . Years of education: Not on file  . Highest education level: Associate degree: academic program  Occupational History  . Occupation: Retired Civil engineer, contracting  . Financial resource strain: Not on file  . Food insecurity:    Worry: Not on file    Inability: Not on file  . Transportation needs:    Medical: Not on file    Non-medical: Not on file  Tobacco Use  . Smoking status: Former Smoker    Types: E-cigarettes    Last attempt to quit: 10/11/1996    Years since quitting: 21.9  . Smokeless tobacco: Former Systems developer    Types: Chew  . Tobacco comment: Pt currenty vapes (3mg  nicotine)  Substance and Sexual Activity  . Alcohol use: Yes    Alcohol/week: 12.0 standard drinks    Types: 12 Cans of beer per week    Comment: 6-8 beers weekly  . Drug use: No  . Sexual activity: Yes  Lifestyle  . Physical activity:    Days per week: Not on file    Minutes per session: Not on file  . Stress: Not on file  Relationships  . Social  connections:    Talks on phone: Not on file    Gets together: Not on file    Attends religious service: Not on file    Active member of club or organization: Not on file    Attends meetings of clubs or organizations: Not on file    Relationship status: Not on file  . Intimate partner violence:    Fear of current or ex partner: Not on file    Emotionally abused: Not on file    Physically abused: Not on file    Forced sexual activity: Not on file  Other Topics Concern  . Not on file  Social History Narrative   Regular exercise:  No   Caffeine Use:  Rarely   Works as a Theatre stage manager.   Smokeless tobacco   Married   2 biological children   4 step children      Patient is right-handed. He lives with  his wife in a one level house. He has been avoiding caffeine for 4-5 months. He does not exercise.       REVIEW OF SYSTEMS: Constitutional: No fevers, chills, or sweats, no generalized fatigue, change in appetite Eyes: No visual changes, double vision, eye pain Ear, nose and throat: No hearing loss, ear pain, nasal congestion, sore throat Cardiovascular: No chest pain, palpitations Respiratory:  No shortness of breath at rest or with exertion, wheezes GastrointestinaI: No nausea, vomiting, diarrhea, abdominal pain, fecal incontinence Genitourinary:  No dysuria, urinary retention or frequency Musculoskeletal:  No neck pain, back pain Integumentary: No rash, pruritus, skin lesions Neurological: as above Psychiatric: No depression, insomnia, anxiety Endocrine: No palpitations, fatigue, diaphoresis, mood swings, change in appetite, change in weight, increased thirst Hematologic/Lymphatic:  No purpura, petechiae. Allergic/Immunologic: no itchy/runny eyes, nasal congestion, recent allergic reactions, rashes  PHYSICAL EXAM: Blood pressure 120/76, pulse 71, height 6' (1.829 m), weight 220 lb (99.8 kg), SpO2 98 %. General: No acute distress.  Patient appears well-groomed.   Head:   Normocephalic/atraumatic Eyes:  Fundi examined but not visualized Neck: supple, no paraspinal tenderness, full range of motion Heart:  Regular rate and rhythm Lungs:  Clear to auscultation bilaterally Back: No paraspinal tenderness Neurological Exam: alert and oriented to person, place, and time. Attention span and concentration intact, recent and remote memory intact, fund of knowledge intact.  Speech fluent and not dysarthric, language intact.  CN II-XII intact. Bulk and tone normal, muscle strength 5/5 throughout.  Sensation to light touch intact.  Deep tendon reflexes 2+ throughout.  Finger to nose and heel to shin testing intact.  Gait normal, Romberg negative.  IMPRESSION: Tension type headaches, not intractable.  He is doing better off of nortriptyline.  PLAN: 1.  He will remain off of nortriptyline.  If headaches become frequent, we can start topiramate 25mg  at bedtime  2.  He will try the flurbiprofen as needed.  Limit use of pain relievers to no more than 2 days out of week to prevent risk of rebound or medication-overuse headache. 3.  Keep headache diary 4.  Follow up in 5 months.  Metta Clines, DO  CC: [name], NP

## 2018-09-17 ENCOUNTER — Ambulatory Visit: Payer: BLUE CROSS/BLUE SHIELD | Admitting: Neurology

## 2018-09-17 ENCOUNTER — Encounter: Payer: Self-pay | Admitting: Neurology

## 2018-09-17 VITALS — BP 120/76 | HR 71 | Ht 72.0 in | Wt 220.0 lb

## 2018-09-17 DIAGNOSIS — G44219 Episodic tension-type headache, not intractable: Secondary | ICD-10-CM

## 2018-09-17 NOTE — Patient Instructions (Signed)
1.  Continue not to take nortriptyline.  If headaches become frequent again, contact me and we can start something different. 2.  If you get a headache, take flurbiprofen 100mg .  May repeat every 8 hours, maximum of 3 tablets in 24 hours. 3.  Limit use of pain relievers to no more than 2 days out of week to prevent risk of rebound or medication-overuse headache. 4.  Keep headache diary 5.  Follow up in 5 months.

## 2018-10-08 ENCOUNTER — Other Ambulatory Visit: Payer: Self-pay | Admitting: Family

## 2018-10-15 ENCOUNTER — Other Ambulatory Visit: Payer: Self-pay | Admitting: Family

## 2019-01-26 ENCOUNTER — Telehealth: Payer: BLUE CROSS/BLUE SHIELD | Admitting: Family

## 2019-01-26 ENCOUNTER — Other Ambulatory Visit: Payer: Self-pay

## 2019-01-28 ENCOUNTER — Ambulatory Visit: Payer: BLUE CROSS/BLUE SHIELD | Admitting: Family

## 2019-02-17 ENCOUNTER — Ambulatory Visit: Payer: BLUE CROSS/BLUE SHIELD | Admitting: Neurology

## 2019-03-24 ENCOUNTER — Encounter: Payer: Self-pay | Admitting: Family

## 2019-03-24 ENCOUNTER — Other Ambulatory Visit: Payer: Self-pay | Admitting: Family

## 2019-03-24 MED ORDER — ATORVASTATIN CALCIUM 10 MG PO TABS: 10.0000 mg | ORAL_TABLET | Freq: Every day | ORAL | 0 refills | Status: DC

## 2019-03-24 MED ORDER — LEVOTHYROXINE SODIUM 125 MCG PO TABS: 125.0000 ug | ORAL_TABLET | Freq: Every day | ORAL | 0 refills | Status: DC

## 2019-03-24 MED ORDER — VERAPAMIL HCL ER 100 MG PO CP24: 100.0000 mg | ORAL_CAPSULE | Freq: Every day | ORAL | 0 refills | Status: DC

## 2019-03-24 MED ORDER — OMEPRAZOLE 40 MG PO CPDR: 40.0000 mg | DELAYED_RELEASE_CAPSULE | Freq: Every day | ORAL | 0 refills | Status: DC

## 2019-05-21 ENCOUNTER — Other Ambulatory Visit: Payer: Self-pay | Admitting: Family

## 2019-06-23 ENCOUNTER — Other Ambulatory Visit: Payer: Self-pay

## 2019-06-24 ENCOUNTER — Ambulatory Visit (INDEPENDENT_AMBULATORY_CARE_PROVIDER_SITE_OTHER): Payer: BC Managed Care – PPO | Admitting: Family

## 2019-06-24 ENCOUNTER — Encounter: Payer: Self-pay | Admitting: Family

## 2019-06-24 VITALS — BP 125/76 | HR 64 | Temp 97.3°F | Resp 16 | Ht 72.0 in | Wt 218.0 lb

## 2019-06-24 DIAGNOSIS — E785 Hyperlipidemia, unspecified: Secondary | ICD-10-CM | POA: Diagnosis not present

## 2019-06-24 DIAGNOSIS — E039 Hypothyroidism, unspecified: Secondary | ICD-10-CM | POA: Diagnosis not present

## 2019-06-24 DIAGNOSIS — K219 Gastro-esophageal reflux disease without esophagitis: Secondary | ICD-10-CM

## 2019-06-24 DIAGNOSIS — G43909 Migraine, unspecified, not intractable, without status migrainosus: Secondary | ICD-10-CM | POA: Diagnosis not present

## 2019-06-24 MED ORDER — VERAPAMIL HCL ER 100 MG PO CP24: 100.0000 mg | ORAL_CAPSULE | Freq: Every day | ORAL | 1 refills | Status: DC

## 2019-06-24 MED ORDER — ATORVASTATIN CALCIUM 10 MG PO TABS: 10.0000 mg | ORAL_TABLET | Freq: Every day | ORAL | 1 refills | Status: DC

## 2019-06-24 MED ORDER — LEVOTHYROXINE SODIUM 125 MCG PO TABS: ORAL_TABLET | ORAL | 1 refills | Status: DC

## 2019-06-24 MED ORDER — OMEPRAZOLE 40 MG PO CPDR: 40.0000 mg | DELAYED_RELEASE_CAPSULE | Freq: Every day | ORAL | 1 refills | Status: DC

## 2019-06-24 NOTE — Progress Notes (Signed)
Subjective:    Patient ID: Terry Fox, male    DOB: 1966/06/27, 53 y.o.   MRN: [id number]  HPI  Patient is a 53 yr old male who presents today for follow up.  Hypothyroid- maintained on synthroid. Reports that he feels tire a lot but attributes that to only sleeping a few hours a night due to school (studying information systems) and work.   Wt Readings from Last 3 Encounters:  06/24/19 218 lb (98.9 kg)  09/17/18 220 lb (99.8 kg)  07/30/18 219 lb 6.4 oz (99.5 kg)    Lab Results  Component Value Date   TSH 3.91 08/09/2018   Hyperlipidemia- continues lipitor. Denies myalgia.  Lab Results  Component Value Date   CHOL 137 08/09/2018   HDL 28.50 (L) 08/09/2018   LDLCALC 66 10/31/2016   LDLDIRECT 84.0 08/09/2018   TRIG 326.0 (H) 08/09/2018   CHOLHDL 5 08/09/2018   Migraines- reports headaches come and goes. Reports that he feels so sleepy on maxalt.  Continues verapamil for migraine prophylaxis. Denies LE edema. He will use excedrin, tylenol or motrin as needed for HA's.   BP Readings from Last 3 Encounters:  06/24/19 125/76  09/17/18 120/76  07/30/18 122/70   GERD- reports symptoms stable on PPI. Symptoms return if he misses a dose.     Review of Systems See HPI  Past Medical History:  Diagnosis Date  . Allergy   . Arthritis   . Depression    in his 54's  . Generalized headaches   . GERD (gastroesophageal reflux disease)   . Hernia   . Thyroid disease    hypothyroidism     Social History   Socioeconomic History  . Marital status: Married    Spouse name: [name]  . Number of children: 6  . Years of education: Not on file  . Highest education level: Associate degree: academic program  Occupational History  . Occupation: Retired Civil engineer, contracting  . Financial resource strain: Not on file  . Food insecurity    Worry: Not on file    Inability: Not on file  . Transportation needs    Medical: Not on file    Non-medical: Not on file  Tobacco Use   . Smoking status: Former Smoker    Types: E-cigarettes    Quit date: 10/11/1996    Years since quitting: 22.7  . Smokeless tobacco: Former Systems developer    Types: Chew  . Tobacco comment: Pt currenty vapes (3mg  nicotine)  Substance and Sexual Activity  . Alcohol use: Yes    Alcohol/week: 12.0 standard drinks    Types: 12 Cans of beer per week    Comment: 6-8 beers weekly  . Drug use: No  . Sexual activity: Yes  Lifestyle  . Physical activity    Days per week: Not on file    Minutes per session: Not on file  . Stress: Not on file  Relationships  . Social Herbalist on phone: Not on file    Gets together: Not on file    Attends religious service: Not on file    Active member of club or organization: Not on file    Attends meetings of clubs or organizations: Not on file    Relationship status: Not on file  . Intimate partner violence    Fear of current or ex partner: Not on file    Emotionally abused: Not on file    Physically abused: Not on file  Forced sexual activity: Not on file  Other Topics Concern  . Not on file  Social History Narrative   Regular exercise:  No   Caffeine Use:  Rarely   Works as a Theatre stage manager.   Smokeless tobacco   Married   2 biological children   4 step children      Patient is right-handed. He lives with his wife in a one level house. He has been avoiding caffeine for 4-5 months. He does not exercise.       Past Surgical History:  Procedure Laterality Date  . HERNIA REPAIR  99991111   umbilical hernia repair-- [facility]   . TONSILLECTOMY  1980's    Family History  Problem Relation Age of Onset  . Arthritis Father   . Hyperlipidemia Mother   . Peripheral vascular disease Mother   . Heart attack Neg Hx   . Hypertension Neg Hx   . Sudden death Neg Hx   . Colon cancer Neg Hx   . Esophageal cancer Neg Hx   . Pancreatic cancer Neg Hx   . Prostate cancer Neg Hx   . Rectal cancer Neg Hx   . Stomach cancer Neg Hx     No Known  Allergies  Current Outpatient Medications on File Prior to Visit  Medication Sig Dispense Refill  . flurbiprofen (ANSAID) 100 MG tablet Take 1 tablet every 8 hours as needed.  Maximum 3 tablets in 24h 24 tablet 3   Current Facility-Administered Medications on File Prior to Visit  Medication Dose Route Frequency Provider Last Rate Last Dose  . 0.9 %  sodium chloride infusion  500 mL Intravenous Continuous Armbruster, Carlota Raspberry, MD        BP 125/76 (BP Location: Right Arm, Patient Position: Sitting, Cuff Size: Large)   Pulse 64   Temp (!) 97.3 F (36.3 C) (Temporal)   Resp 16   Ht 6' (1.829 m)   Wt 218 lb (98.9 kg)   SpO2 98%   BMI 29.57 kg/m       Objective:   Physical Exam Constitutional:      General: He is not in acute distress.    Appearance: He is well-developed.  HENT:     Head: Normocephalic and atraumatic.  Cardiovascular:     Rate and Rhythm: Normal rate and regular rhythm.     Heart sounds: No murmur.  Pulmonary:     Effort: Pulmonary effort is normal. No respiratory distress.     Breath sounds: Normal breath sounds. No wheezing or rales.  Skin:    General: Skin is warm and dry.  Neurological:     Mental Status: He is alert and oriented to person, place, and time.  Psychiatric:        Behavior: Behavior normal.        Thought Content: Thought content normal.           Assessment & Plan:  Migraines- fair control on verapamil. Continue same.  Hypothyroid- obtain follow up TSH, continue synthroid.  Hyperlipidemia- obtain follow up lipid panel. Continue statin.   GERD- stable on PPI, continue saem.

## 2019-06-25 LAB — LIPID PANEL
Cholesterol: 124 mg/dL (ref <200)
HDL: 31 mg/dL — ABNORMAL LOW (ref >=40)
LDL Cholesterol (Calc): 62 mg/dL (calc)
Non-HDL Cholesterol (Calc): 93 mg/dL (calc) (ref <130)
Total CHOL/HDL Ratio: 4 (calc) (ref <5.0)
Triglycerides: 297 mg/dL — ABNORMAL HIGH (ref <150)

## 2019-06-25 LAB — TSH: TSH: 1.99 mIU/L (ref 0.40–4.50)

## 2019-09-23 ENCOUNTER — Encounter: Payer: Self-pay | Admitting: Family

## 2019-09-23 ENCOUNTER — Other Ambulatory Visit: Payer: Self-pay

## 2019-09-23 ENCOUNTER — Ambulatory Visit (INDEPENDENT_AMBULATORY_CARE_PROVIDER_SITE_OTHER): Payer: BC Managed Care – PPO | Admitting: Family

## 2019-09-23 VITALS — BP 132/85 | HR 62 | Temp 96.2°F | Resp 16 | Ht 72.0 in | Wt 233.0 lb

## 2019-09-23 DIAGNOSIS — Z0000 Encounter for general adult medical examination without abnormal findings: Secondary | ICD-10-CM

## 2019-09-23 DIAGNOSIS — Z23 Encounter for immunization: Secondary | ICD-10-CM

## 2019-09-23 NOTE — Progress Notes (Signed)
Subjective:    Patient ID: Terry Fox, male    DOB: [birth year], 54 y.o.   MRN: [id number]  HPI  Patient presents today for complete physical.  Immunizations: Shingrix due Diet: healthy Exercise: very little Wt Readings from Last 3 Encounters:  09/23/19 233 lb (105.7 kg)  06/24/19 218 lb (98.9 kg)  09/17/18 220 lb (99.8 kg)  Colonoscopy: 2018- needs follow up 2023 PSA:  Lab Results  Component Value Date   PSA 0.36 01/18/2018   PSA 0.40 10/31/2016  Dental: 2 yrs ago Vision: today    Review of Systems  Constitutional: Positive for unexpected weight change.  HENT: Negative for hearing loss and rhinorrhea.   Eyes: Negative for visual disturbance.  Respiratory: Negative for cough and shortness of breath.   Cardiovascular: Negative for chest pain.  Gastrointestinal: Negative for constipation and diarrhea.  Genitourinary: Negative for difficulty urinating, dysuria, frequency and hematuria.  Musculoskeletal: Negative for arthralgias and myalgias.  Skin: Negative for rash.  Neurological: Positive for headaches (reports 2-3 headaches/week).  Hematological: Negative for adenopathy.  Psychiatric/Behavioral:       Denies depression/anxiety       Past Medical History:  Diagnosis Date  . Allergy   . Arthritis   . Depression    in his 57's  . Generalized headaches   . GERD (gastroesophageal reflux disease)   . Hernia   . Thyroid disease    hypothyroidism     Social History   Socioeconomic History  . Marital status: Married    Spouse name: [name]  . Number of children: 6  . Years of education: Not on file  . Highest education level: Associate degree: academic program  Occupational History  . Occupation: Retired Medical illustrator  . Smoking status: Former Smoker    Types: E-cigarettes    Quit date: 10/11/1996    Years since quitting: 22.9  . Smokeless tobacco: Former Systems developer    Types: Chew  . Tobacco comment: Pt currenty vapes (3mg  nicotine)  Substance and  Sexual Activity  . Alcohol use: Yes    Alcohol/week: 12.0 standard drinks    Types: 12 Cans of beer per week    Comment: 6-8 beers weekly  . Drug use: No  . Sexual activity: Yes  Other Topics Concern  . Not on file  Social History Narrative   Regular exercise:  No   Caffeine Use:  Rarely   Works as a Theatre stage manager.   Smokeless tobacco   Married   2 biological children   4 step children      Patient is right-handed. He lives with his wife in a one level house. He has been avoiding caffeine for 4-5 months. He does not exercise.      Social Determinants of Health   Financial Resource Strain:   . Difficulty of Paying Living Expenses: Not on file  Food Insecurity:   . Worried About Charity fundraiser in the Last Year: Not on file  . Ran Out of Food in the Last Year: Not on file  Transportation Needs:   . Lack of Transportation (Medical): Not on file  . Lack of Transportation (Non-Medical): Not on file  Physical Activity:   . Days of Exercise per Week: Not on file  . Minutes of Exercise per Session: Not on file  Stress:   . Feeling of Stress : Not on file  Social Connections:   . Frequency of Communication with Friends and Family: Not on file  .  Frequency of Social Gatherings with Friends and Family: Not on file  . Attends Religious Services: Not on file  . Active Member of Clubs or Organizations: Not on file  . Attends Archivist Meetings: Not on file  . Marital Status: Not on file  Intimate Partner Violence:   . Fear of Current or Ex-Partner: Not on file  . Emotionally Abused: Not on file  . Physically Abused: Not on file  . Sexually Abused: Not on file    Past Surgical History:  Procedure Laterality Date  . HERNIA REPAIR  99991111   umbilical hernia repair-- [facility]   . TONSILLECTOMY  1980's    Family History  Problem Relation Age of Onset  . Arthritis Father   . Hyperlipidemia Mother   . Peripheral vascular disease Mother   . Heart attack Neg Hx   .  Hypertension Neg Hx   . Sudden death Neg Hx   . Colon cancer Neg Hx   . Esophageal cancer Neg Hx   . Pancreatic cancer Neg Hx   . Prostate cancer Neg Hx   . Rectal cancer Neg Hx   . Stomach cancer Neg Hx     No Known Allergies  Current Outpatient Medications on File Prior to Visit  Medication Sig Dispense Refill  . atorvastatin (LIPITOR) 10 MG tablet Take 1 tablet (10 mg total) by mouth daily. 90 tablet 1  . flurbiprofen (ANSAID) 100 MG tablet Take 1 tablet every 8 hours as needed.  Maximum 3 tablets in 24h 24 tablet 3  . levothyroxine (SYNTHROID) 125 MCG tablet TAKE 1 TABLET BY MOUTH  DAILY BEFORE BREAKFAST 90 tablet 1  . omeprazole (PRILOSEC) 40 MG capsule Take 1 capsule (40 mg total) by mouth daily. 90 capsule 1  . verapamil (VERELAN) 100 MG 24 hr capsule Take 1 capsule (100 mg total) by mouth at bedtime. 90 capsule 1   Current Facility-Administered Medications on File Prior to Visit  Medication Dose Route Frequency Provider Last Rate Last Admin  . 0.9 %  sodium chloride infusion  500 mL Intravenous Continuous Armbruster, Carlota Raspberry, MD        BP 132/85 (BP Location: Right Arm, Patient Position: Sitting, Cuff Size: Large)   Pulse 62   Temp (!) 96.2 F (35.7 C) (Oral)   Resp 16   Ht 6' (1.829 m)   Wt 233 lb (105.7 kg)   SpO2 100%   BMI 31.60 kg/m    Objective:   Physical Exam  Physical Exam  Constitutional: He is oriented to person, place, and time. He appears well-developed and well-nourished. No distress.  HENT:  Head: Normocephalic and atraumatic.  Right Ear: Tympanic membrane and ear canal normal.  Left Ear: Tympanic membrane and ear canal normal.  Mouth/Throat: not examined wearing mask Eyes: Pupils are equal, round, and reactive to light. No scleral icterus.  Neck: Normal range of motion. No thyromegaly present.  Cardiovascular: Normal rate and regular rhythm.   No murmur heard. Pulmonary/Chest: Effort normal and breath sounds normal. No respiratory distress.  He has no wheezes. He has no rales. He exhibits no tenderness.  Abdominal: Soft. Bowel sounds are normal. He exhibits no distension and no mass. There is no tenderness. There is no rebound and no guarding.  Musculoskeletal: He exhibits no edema.  Lymphadenopathy:    He has no cervical adenopathy.  Neurological: He is alert and oriented to person, place, and time. He has normal patellar reflexes. He exhibits normal muscle tone. Coordination normal.  Skin: Skin is warm and dry.  Psychiatric: He has a normal mood and affect. His behavior is normal. Judgment and thought content normal.           Assessment & Plan:   Preventative care- discussed healthy diet, exercise, weight loss.  Colo up to date.  Shingrix #1 today. Obtain routine lab work.       Assessment & Plan:

## 2019-09-23 NOTE — Patient Instructions (Signed)
Please complete lab work prior to leaving.   

## 2019-09-24 LAB — BASIC METABOLIC PANEL
BUN: 18 mg/dL (ref 7–25)
CO2: 26 mmol/L (ref 20–32)
Calcium: 10 mg/dL (ref 8.6–10.3)
Chloride: 105 mmol/L (ref 98–110)
Creat: 1.31 mg/dL (ref 0.70–1.33)
Glucose, Bld: 95 mg/dL (ref 65–99)
Potassium: 4.9 mmol/L (ref 3.5–5.3)
Sodium: 142 mmol/L (ref 135–146)

## 2019-09-24 LAB — CBC WITH DIFFERENTIAL/PLATELET
Absolute Monocytes: 737 cells/uL (ref 200–950)
Basophils Absolute: 50 cells/uL (ref 0–200)
Basophils Relative: 0.8 %
Eosinophils Absolute: 221 cells/uL (ref 15–500)
Eosinophils Relative: 3.5 %
HCT: 42.8 % (ref 38.5–50.0)
Hemoglobin: 14.3 g/dL (ref 13.2–17.1)
Lymphs Abs: 1758 cells/uL (ref 850–3900)
MCH: 30.3 pg (ref 27.0–33.0)
MCHC: 33.4 g/dL (ref 32.0–36.0)
MCV: 90.7 fL (ref 80.0–100.0)
MPV: 11.5 fL (ref 7.5–12.5)
Monocytes Relative: 11.7 %
Neutro Abs: 3534 cells/uL (ref 1500–7800)
Neutrophils Relative %: 56.1 %
Platelets: 198 10*3/uL (ref 140–400)
RBC: 4.72 10*6/uL (ref 4.20–5.80)
RDW: 12.7 % (ref 11.0–15.0)
Total Lymphocyte: 27.9 %
WBC: 6.3 10*3/uL (ref 3.8–10.8)

## 2019-09-24 LAB — HEPATIC FUNCTION PANEL
AG Ratio: 1.7 (calc) (ref 1.0–2.5)
ALT: 22 U/L (ref 9–46)
AST: 21 U/L (ref 10–35)
Albumin: 4.5 g/dL (ref 3.6–5.1)
Alkaline phosphatase (APISO): 90 U/L (ref 35–144)
Bilirubin, Direct: 0.2 mg/dL (ref 0.0–0.2)
Globulin: 2.7 g/dL (calc) (ref 1.9–3.7)
Indirect Bilirubin: 1.4 mg/dL (calc) — ABNORMAL HIGH (ref 0.2–1.2)
Total Bilirubin: 1.6 mg/dL — ABNORMAL HIGH (ref 0.2–1.2)
Total Protein: 7.2 g/dL (ref 6.1–8.1)

## 2019-09-24 LAB — TSH: TSH: 2.96 mIU/L (ref 0.40–4.50)

## 2019-09-24 LAB — LIPID PANEL
Cholesterol: 150 mg/dL (ref <200)
HDL: 33 mg/dL — ABNORMAL LOW (ref >=40)
Non-HDL Cholesterol (Calc): 117 mg/dL (calc) (ref <130)
Total CHOL/HDL Ratio: 4.5 (calc) (ref <5.0)
Triglycerides: 416 mg/dL — ABNORMAL HIGH (ref <150)

## 2019-09-24 LAB — PSA: PSA: 0.3 ng/mL (ref <=4.0)

## 2019-11-30 ENCOUNTER — Other Ambulatory Visit: Payer: Self-pay

## 2019-11-30 ENCOUNTER — Ambulatory Visit (INDEPENDENT_AMBULATORY_CARE_PROVIDER_SITE_OTHER): Payer: BC Managed Care – PPO

## 2019-11-30 DIAGNOSIS — Z23 Encounter for immunization: Secondary | ICD-10-CM | POA: Diagnosis not present

## 2019-11-30 NOTE — Progress Notes (Signed)
Pt in for Shingrix #2, tolerated well.

## 2020-01-16 ENCOUNTER — Other Ambulatory Visit: Payer: Self-pay

## 2020-01-16 ENCOUNTER — Telehealth: Payer: Self-pay | Admitting: Family

## 2020-01-16 MED ORDER — LEVOTHYROXINE SODIUM 125 MCG PO TABS: ORAL_TABLET | ORAL | 1 refills | Status: DC

## 2020-01-16 NOTE — Telephone Encounter (Signed)
Prescription sent to pharmacy.

## 2020-01-16 NOTE — Telephone Encounter (Signed)
Medication:levothyroxine (SYNTHROID) 125   Has the patient contacted their pharmacy? Yes.   (If no, request that the patient contact the pharmacy for the refill.) (If yes, when and what did the pharmacy advise?)  Unable to get refill   Preferred Pharmacy (with phone number or street name): [place]  [address] [place] [address]  Phone:  [phone] Fax:  [phone] Agent: Please be advised that RX refills may take up to 3 business days. We ask that you follow-up with your pharmacy.

## 2020-01-18 ENCOUNTER — Other Ambulatory Visit: Payer: Self-pay

## 2020-01-18 MED ORDER — OMEPRAZOLE 40 MG PO CPDR: 40.0000 mg | DELAYED_RELEASE_CAPSULE | Freq: Every day | ORAL | 1 refills | Status: DC

## 2020-01-18 MED ORDER — ATORVASTATIN CALCIUM 10 MG PO TABS: 10.0000 mg | ORAL_TABLET | Freq: Every day | ORAL | 1 refills | Status: DC

## 2020-01-18 MED ORDER — VERAPAMIL HCL ER 100 MG PO CP24: 100.0000 mg | ORAL_CAPSULE | Freq: Every day | ORAL | 1 refills | Status: DC

## 2020-03-23 ENCOUNTER — Other Ambulatory Visit: Payer: Self-pay

## 2020-03-23 ENCOUNTER — Encounter: Payer: Self-pay | Admitting: Family

## 2020-03-23 ENCOUNTER — Ambulatory Visit (INDEPENDENT_AMBULATORY_CARE_PROVIDER_SITE_OTHER): Payer: BC Managed Care – PPO | Admitting: Family

## 2020-03-23 VITALS — BP 120/78 | HR 65 | Temp 78.0°F | Resp 16 | Ht 72.0 in | Wt 236.0 lb

## 2020-03-23 DIAGNOSIS — E785 Hyperlipidemia, unspecified: Secondary | ICD-10-CM

## 2020-03-23 DIAGNOSIS — E039 Hypothyroidism, unspecified: Secondary | ICD-10-CM

## 2020-03-23 NOTE — Progress Notes (Signed)
Subjective:    Patient ID: Terry Fox, male    DOB: 10-Jan-1966, 54 y.o.   MRN: [id number]  HPI   Patient is a 54 yr old male who presents today for follow up.  Hypothyroid- patient is maintained on synthroid. Reports feeling well on this dose. He has had some weight gain which he attributes to inactivity.   Wt Readings from Last 3 Encounters:  03/23/20 236 lb (107 kg)  09/23/19 233 lb (105.7 kg)  06/24/19 218 lb (98.9 kg)    Lab Results  Component Value Date   TSH 2.96 09/23/2019   Hyperlipidemia- continues atorvastatin Lab Results  Component Value Date   CHOL 150 09/23/2019   HDL 33 (L) 09/23/2019   Purple Sage  09/23/2019     Comment:     . LDL cholesterol not calculated. Triglyceride levels greater than 400 mg/dL invalidate calculated LDL results. . Reference range: <100 . Desirable range <100 mg/dL for primary prevention;   <70 mg/dL for patients with CHD or diabetic patients  with > or = 2 CHD risk factors. Marland Kitchen LDL-C is now calculated using the Martin-Hopkins  calculation, which is a validated novel method providing  better accuracy than the Friedewald equation in the  estimation of LDL-C.  Cresenciano Genre et al. Annamaria Helling. 1308;657(84): 2061-2068  (http://education.[facility].com/faq/FAQ164)    LDLDIRECT 84.0 08/09/2018   TRIG 416 (H) 09/23/2019   CHOLHDL 4.5 09/23/2019      Review of Systems    see HPI  Past Medical History:  Diagnosis Date  . Allergy   . Arthritis   . Depression    in his 62's  . Generalized headaches   . GERD (gastroesophageal reflux disease)   . Hernia   . Thyroid disease    hypothyroidism     Social History   Socioeconomic History  . Marital status: Married    Spouse name: [name]  . Number of children: 6  . Years of education: Not on file  . Highest education level: Associate degree: academic program  Occupational History  . Occupation: Retired Medical illustrator  . Smoking status: Former Smoker    Types:  E-cigarettes    Quit date: 10/11/1996    Years since quitting: 23.4  . Smokeless tobacco: Former Systems developer    Types: Chew  . Tobacco comment: Pt currenty vapes (3mg  nicotine)  Substance and Sexual Activity  . Alcohol use: Yes    Alcohol/week: 12.0 standard drinks    Types: 12 Cans of beer per week    Comment: 6-8 beers weekly  . Drug use: No  . Sexual activity: Yes  Other Topics Concern  . Not on file  Social History Narrative   Regular exercise:  No   Caffeine Use:  Rarely   Works as a Theatre stage manager.   Smokeless tobacco   Married   2 biological children   4 step children      Patient is right-handed. He lives with his wife in a one level house. He has been avoiding caffeine for 4-5 months. He does not exercise.      Social Determinants of Health   Financial Resource Strain:   . Difficulty of Paying Living Expenses:   Food Insecurity:   . Worried About Charity fundraiser in the Last Year:   . Arboriculturist in the Last Year:   Transportation Needs:   . Film/video editor (Medical):   Marland Kitchen Lack of Transportation (Non-Medical):   Physical Activity:   .  Days of Exercise per Week:   . Minutes of Exercise per Session:   Stress:   . Feeling of Stress :   Social Connections:   . Frequency of Communication with Friends and Family:   . Frequency of Social Gatherings with Friends and Family:   . Attends Religious Services:   . Active Member of Clubs or Organizations:   . Attends Archivist Meetings:   Marland Kitchen Marital Status:   Intimate Partner Violence:   . Fear of Current or Ex-Partner:   . Emotionally Abused:   Marland Kitchen Physically Abused:   . Sexually Abused:     Past Surgical History:  Procedure Laterality Date  . HERNIA REPAIR  12/15/91   umbilical hernia repair-- [facility]   . TONSILLECTOMY  1980's    Family History  Problem Relation Age of Onset  . Arthritis Father   . Hyperlipidemia Mother   . Peripheral vascular disease Mother   . Heart attack Neg Hx   .  Hypertension Neg Hx   . Sudden death Neg Hx   . Colon cancer Neg Hx   . Esophageal cancer Neg Hx   . Pancreatic cancer Neg Hx   . Prostate cancer Neg Hx   . Rectal cancer Neg Hx   . Stomach cancer Neg Hx     No Known Allergies  Current Outpatient Medications on File Prior to Visit  Medication Sig Dispense Refill  . atorvastatin (LIPITOR) 10 MG tablet Take 1 tablet (10 mg total) by mouth daily. 90 tablet 1  . flurbiprofen (ANSAID) 100 MG tablet Take 1 tablet every 8 hours as needed.  Maximum 3 tablets in 24h 24 tablet 3  . levothyroxine (SYNTHROID) 125 MCG tablet TAKE 1 TABLET BY MOUTH  DAILY BEFORE BREAKFAST 90 tablet 1  . omeprazole (PRILOSEC) 40 MG capsule Take 1 capsule (40 mg total) by mouth daily. 90 capsule 1  . verapamil (VERELAN) 100 MG 24 hr capsule Take 1 capsule (100 mg total) by mouth at bedtime. 90 capsule 1   Current Facility-Administered Medications on File Prior to Visit  Medication Dose Route Frequency Provider Last Rate Last Admin  . 0.9 %  sodium chloride infusion  500 mL Intravenous Continuous Armbruster, Carlota Raspberry, MD        BP 120/78 (BP Location: Right Arm, Patient Position: Sitting, Cuff Size: Large)   Pulse 65   Temp (!) 78 F (25.6 C) (Oral)   Resp 16   Ht 6' (1.829 m)   Wt 236 lb (107 kg)   SpO2 99%   BMI 32.01 kg/m    Objective:   Physical Exam Constitutional:      General: He is not in acute distress.    Appearance: He is well-developed.  HENT:     Head: Normocephalic and atraumatic.  Cardiovascular:     Rate and Rhythm: Normal rate and regular rhythm.     Heart sounds: No murmur heard.   Pulmonary:     Effort: Pulmonary effort is normal. No respiratory distress.     Breath sounds: Normal breath sounds. No wheezing or rales.  Skin:    General: Skin is warm and dry.  Neurological:     Mental Status: He is alert and oriented to person, place, and time.  Psychiatric:        Behavior: Behavior normal.        Thought Content: Thought  content normal.           Assessment & Plan:  Hypothyroid-will  obtain follow-up TSH.  We discussed importance of adding routine exercise to healthy diet to assist with weight loss.  Hyperlipidemia-plan to have him return for fasting lipid panel.  Continue statin.  This visit occurred during the SARS-CoV-2 public health emergency.  Safety protocols were in place, including screening questions prior to the visit, additional usage of staff PPE, and extensive cleaning of exam room while observing appropriate contact time as indicated for disinfecting solutions.

## 2020-03-27 ENCOUNTER — Other Ambulatory Visit (INDEPENDENT_AMBULATORY_CARE_PROVIDER_SITE_OTHER): Payer: BC Managed Care – PPO

## 2020-03-27 ENCOUNTER — Telehealth: Payer: Self-pay | Admitting: Family

## 2020-03-27 ENCOUNTER — Other Ambulatory Visit: Payer: Self-pay

## 2020-03-27 DIAGNOSIS — E785 Hyperlipidemia, unspecified: Secondary | ICD-10-CM | POA: Diagnosis not present

## 2020-03-27 DIAGNOSIS — E039 Hypothyroidism, unspecified: Secondary | ICD-10-CM

## 2020-03-27 LAB — LIPID PANEL
Cholesterol: 138 mg/dL (ref 0–200)
HDL: 32.9 mg/dL — ABNORMAL LOW (ref >39.00)
NonHDL: 105.14
Total CHOL/HDL Ratio: 4
Triglycerides: 283 mg/dL — ABNORMAL HIGH (ref 0.0–149.0)
VLDL: 56.6 mg/dL — ABNORMAL HIGH (ref 0.0–40.0)

## 2020-03-27 LAB — TSH: TSH: 9.03 u[IU]/mL — ABNORMAL HIGH (ref 0.35–4.50)

## 2020-03-27 LAB — LDL CHOLESTEROL, DIRECT: Direct LDL: 75 mg/dL

## 2020-03-27 MED ORDER — FENOFIBRATE 145 MG PO TABS
145.0000 mg | ORAL_TABLET | Freq: Every day | ORAL | 5 refills | Status: DC
Start: 2020-03-27 — End: 2020-03-28

## 2020-03-27 MED ORDER — LEVOTHYROXINE SODIUM 150 MCG PO TABS
150.0000 ug | ORAL_TABLET | Freq: Every day | ORAL | 0 refills | Status: DC
Start: 2020-03-27 — End: 2020-03-28

## 2020-03-27 NOTE — Telephone Encounter (Signed)
Please advise pt that triglycerides are improved but still elevated. I would recommend that he continue to work on limiting white carbs and concentrated sweets. Also, I would like him to add fenofibrate once daily. Rx has been sent to [facility]

## 2020-03-27 NOTE — Addendum Note (Signed)
Addended by: Caffie Pinto on: 03/27/2020 07:45 AM   Modules accepted: Orders

## 2020-03-27 NOTE — Addendum Note (Signed)
Addended by: Caffie Pinto on: 03/27/2020 07:21 AM   Modules accepted: Orders

## 2020-03-27 NOTE — Telephone Encounter (Signed)
Also, labs show synthroid should be increased. Rx sent for 136mcg to his local pharmacy. Repeat tsh in 6 weeks.

## 2020-03-28 ENCOUNTER — Other Ambulatory Visit: Payer: Self-pay

## 2020-03-28 DIAGNOSIS — E039 Hypothyroidism, unspecified: Secondary | ICD-10-CM

## 2020-03-28 MED ORDER — FENOFIBRATE 145 MG PO TABS: 145.0000 mg | ORAL_TABLET | Freq: Every day | ORAL | 1 refills | Status: DC

## 2020-03-28 MED ORDER — LEVOTHYROXINE SODIUM 150 MCG PO TABS: 150.0000 ug | ORAL_TABLET | Freq: Every day | ORAL | 0 refills | Status: DC

## 2020-04-02 NOTE — Telephone Encounter (Signed)
Did you see this message?

## 2020-04-02 NOTE — Telephone Encounter (Signed)
Yes, patient was scheduled last week to come in 05-16-2020 for TSH. (forgot to document)

## 2020-05-11 ENCOUNTER — Encounter: Payer: Self-pay | Admitting: Family

## 2020-05-15 NOTE — Addendum Note (Signed)
Addended by: Kelle Darting A on: 05/15/2020 11:46 AM   Modules accepted: Orders

## 2020-05-16 ENCOUNTER — Other Ambulatory Visit: Payer: Self-pay

## 2020-05-16 ENCOUNTER — Other Ambulatory Visit (INDEPENDENT_AMBULATORY_CARE_PROVIDER_SITE_OTHER): Payer: BC Managed Care – PPO

## 2020-05-16 DIAGNOSIS — E039 Hypothyroidism, unspecified: Secondary | ICD-10-CM | POA: Diagnosis not present

## 2020-05-17 LAB — TSH: TSH: 0.62 mIU/L (ref 0.40–4.50)

## 2020-05-24 ENCOUNTER — Other Ambulatory Visit: Payer: Self-pay | Admitting: Family

## 2020-06-02 ENCOUNTER — Other Ambulatory Visit: Payer: Self-pay | Admitting: Family

## 2020-06-27 ENCOUNTER — Ambulatory Visit: Payer: BC Managed Care – PPO | Admitting: Internal Medicine

## 2020-08-04 ENCOUNTER — Other Ambulatory Visit: Payer: Self-pay | Admitting: Family

## 2020-09-27 ENCOUNTER — Other Ambulatory Visit: Payer: Self-pay

## 2020-09-28 ENCOUNTER — Ambulatory Visit (INDEPENDENT_AMBULATORY_CARE_PROVIDER_SITE_OTHER): Payer: BC Managed Care – PPO | Admitting: Family

## 2020-09-28 ENCOUNTER — Encounter: Payer: Self-pay | Admitting: Family

## 2020-09-28 VITALS — BP 127/70 | HR 69 | Temp 98.1°F | Resp 18 | Wt 225.6 lb

## 2020-09-28 DIAGNOSIS — E039 Hypothyroidism, unspecified: Secondary | ICD-10-CM

## 2020-09-28 DIAGNOSIS — Z0000 Encounter for general adult medical examination without abnormal findings: Secondary | ICD-10-CM

## 2020-09-28 DIAGNOSIS — E785 Hyperlipidemia, unspecified: Secondary | ICD-10-CM

## 2020-09-28 NOTE — Progress Notes (Signed)
Subjective:    Patient ID: Terry Fox, male    DOB: [birth year], 55 y.o.   MRN: [id number]  HPI  Patient presents today for complete physical.  Immunizations: Moderna x 2 , flu shot, tetanus and shingrix up to date Diet: reports that his diet is improving Wt Readings from Last 3 Encounters:  09/28/20 225 lb 9.6 oz (102.3 kg)  03/23/20 236 lb (107 kg)  09/23/19 233 lb (105.7 kg)  Exercise: Gym and walking.  Colonoscopy: due 2023 Vision: 12/20- due  Dental: overdue  Review of Systems  Constitutional: Negative for unexpected weight change.  HENT: Negative for hearing loss and rhinorrhea.   Eyes: Negative for visual disturbance.  Respiratory: Negative for cough and shortness of breath.   Cardiovascular: Negative for chest pain.  Gastrointestinal: Negative for blood in stool, constipation and diarrhea.  Genitourinary: Negative for difficulty urinating and hematuria.  Musculoskeletal: Negative for arthralgias and myalgias.  Skin: Negative for rash.  Neurological: Positive for headaches (occur about once a month).  Hematological: Negative for adenopathy.  Psychiatric/Behavioral:       Denies depression/anxiety   Past Medical History:  Diagnosis Date  . Allergy   . Arthritis   . Depression    in his 49's  . Generalized headaches   . GERD (gastroesophageal reflux disease)   . Hernia   . Thyroid disease    hypothyroidism     Social History   Socioeconomic History  . Marital status: Married    Spouse name: [name]  . Number of children: 6  . Years of education: Not on file  . Highest education level: Associate degree: academic program  Occupational History  . Occupation: Retired Medical illustrator  . Smoking status: Former Smoker    Quit date: 10/11/1996    Years since quitting: 23.9  . Smokeless tobacco: Former Systems developer    Types: Chew  Substance and Sexual Activity  . Alcohol use: Yes    Alcohol/week: 12.0 standard drinks    Types: 12 Cans of beer per week     Comment: 1-2 beers a week  . Drug use: No  . Sexual activity: Yes  Other Topics Concern  . Not on file  Social History Narrative   Regular exercise:  No   Caffeine Use:  Rarely   Works as a Theatre stage manager.   Smokeless tobacco   Married   2 biological children   4 step children      Patient is right-handed. He lives with his wife in a one level house. He has been avoiding caffeine for 4-5 months. He does not exercise.      Social Determinants of Health   Financial Resource Strain: Not on file  Food Insecurity: Not on file  Transportation Needs: Not on file  Physical Activity: Not on file  Stress: Not on file  Social Connections: Not on file  Intimate Partner Violence: Not on file    Past Surgical History:  Procedure Laterality Date  . HERNIA REPAIR  99991111   umbilical hernia repair-- [facility]   . TONSILLECTOMY  1980's    Family History  Problem Relation Age of Onset  . Arthritis Father   . Hyperlipidemia Mother   . Peripheral vascular disease Mother   . Diabetes Mellitus II Brother   . Obesity Brother   . Hip fracture Maternal Grandmother   . Heart attack Maternal Grandfather        died age 98, smoker/obese  . Dementia Paternal Grandmother   .  Alcohol abuse Paternal Grandfather   . Hypertension Neg Hx   . Sudden death Neg Hx   . Colon cancer Neg Hx   . Esophageal cancer Neg Hx   . Pancreatic cancer Neg Hx   . Prostate cancer Neg Hx   . Rectal cancer Neg Hx   . Stomach cancer Neg Hx     No Known Allergies  Current Outpatient Medications on File Prior to Visit  Medication Sig Dispense Refill  . atorvastatin (LIPITOR) 10 MG tablet TAKE 1 TABLET BY MOUTH  DAILY 90 tablet 3  . fenofibrate (TRICOR) 145 MG tablet Take 1 tablet (145 mg total) by mouth daily. 90 tablet 0  . levothyroxine (SYNTHROID) 150 MCG tablet TAKE 1 TABLET BY MOUTH  DAILY 90 tablet 1  . omeprazole (PRILOSEC) 40 MG capsule TAKE 1 CAPSULE BY MOUTH  DAILY 90 capsule 3  . verapamil (VERELAN) 100  MG 24 hr capsule TAKE 1 CAPSULE BY MOUTH AT  BEDTIME 90 capsule 3  . flurbiprofen (ANSAID) 100 MG tablet Take 1 tablet every 8 hours as needed.  Maximum 3 tablets in 24h (Patient not taking: Reported on 09/28/2020) 24 tablet 3   Current Facility-Administered Medications on File Prior to Visit  Medication Dose Route Frequency Provider Last Rate Last Admin  . 0.9 %  sodium chloride infusion  500 mL Intravenous Continuous Armbruster, Carlota Raspberry, MD        BP 127/70 (BP Location: Left Arm, Patient Position: Sitting, Cuff Size: Normal)   Pulse 69   Temp 98.1 F (36.7 C) (Oral)   Resp 18   Wt 225 lb 9.6 oz (102.3 kg)   SpO2 100%   BMI 30.60 kg/m        Objective:   Physical Exam Physical Exam  Constitutional: He is oriented to person, place, and time. He appears well-developed and well-nourished. No distress.  HENT:  Head: Normocephalic and atraumatic.  Right Ear: Tympanic membrane and ear canal normal.  Left Ear: Tympanic membrane and ear canal normal.  Mouth/Throat: Not examined- pt wearing mask Eyes: Pupils are equal, round, and reactive to light. No scleral icterus.  Neck: Normal range of motion. No thyromegaly present.  Cardiovascular: Normal rate and regular rhythm.   No murmur heard. Pulmonary/Chest: Effort normal and breath sounds normal. No respiratory distress. He has no wheezes. He has no rales. He exhibits no tenderness.  Abdominal: Soft. Bowel sounds are normal. He exhibits no distension and no mass. There is no tenderness. There is no rebound and no guarding.  Musculoskeletal: He exhibits no edema.  Lymphadenopathy:    He has no cervical adenopathy.  Neurological: He is alert and oriented to person, place, and time. He has normal patellar reflexes. He exhibits normal muscle tone. Coordination normal.  Skin: Skin is warm and dry.  Psychiatric: He has a normal mood and affect. His behavior is normal. Judgment and thought content normal.           Assessment & Plan:   Preventative care- encouraged pt to continue his healthy diet, exercise and weight loss efforts. Recommended Covid booster after 11/01/20. Colo up to date. PSA normal last year. He is completely nicotine free now and I commended him on this.  Lab Results  Component Value Date   PSA 0.3 09/23/2019   PSA 0.36 01/18/2018   PSA 0.40 10/31/2016    This visit occurred during the SARS-CoV-2 public health emergency.  Safety protocols were in place, including screening questions prior to the visit,  additional usage of staff PPE, and extensive cleaning of exam room while observing appropriate contact time as indicated for disinfecting solutions.         Assessment & Plan:

## 2020-09-28 NOTE — Patient Instructions (Addendum)
Please schedule a routine dental and vision exam.  Continue your great work on healthy diet, and regular exercise.

## 2020-09-29 ENCOUNTER — Telehealth: Payer: Self-pay | Admitting: Family

## 2020-09-29 DIAGNOSIS — E875 Hyperkalemia: Secondary | ICD-10-CM

## 2020-09-29 LAB — COMPREHENSIVE METABOLIC PANEL
AG Ratio: 1.6 (calc) (ref 1.0–2.5)
ALT: 29 U/L (ref 9–46)
AST: 22 U/L (ref 10–35)
Albumin: 4.5 g/dL (ref 3.6–5.1)
Alkaline phosphatase (APISO): 60 U/L (ref 35–144)
BUN/Creatinine Ratio: 23 (calc) — ABNORMAL HIGH (ref 6–22)
BUN: 34 mg/dL — ABNORMAL HIGH (ref 7–25)
CO2: 25 mmol/L (ref 20–32)
Calcium: 9.9 mg/dL (ref 8.6–10.3)
Chloride: 107 mmol/L (ref 98–110)
Creat: 1.48 mg/dL — ABNORMAL HIGH (ref 0.70–1.33)
Globulin: 2.9 g/dL (calc) (ref 1.9–3.7)
Glucose, Bld: 97 mg/dL (ref 65–99)
Potassium: 5.6 mmol/L — ABNORMAL HIGH (ref 3.5–5.3)
Sodium: 140 mmol/L (ref 135–146)
Total Bilirubin: 0.4 mg/dL (ref 0.2–1.2)
Total Protein: 7.4 g/dL (ref 6.1–8.1)

## 2020-09-29 LAB — LIPID PANEL
Cholesterol: 134 mg/dL (ref <200)
HDL: 36 mg/dL — ABNORMAL LOW (ref >=40)
LDL Cholesterol (Calc): 80 mg/dL (calc)
Non-HDL Cholesterol (Calc): 98 mg/dL (calc) (ref <130)
Total CHOL/HDL Ratio: 3.7 (calc) (ref <5.0)
Triglycerides: 97 mg/dL (ref <150)

## 2020-09-29 LAB — TSH: TSH: 1.06 mIU/L (ref 0.40–4.50)

## 2020-09-29 NOTE — Telephone Encounter (Signed)
Please advise pt that his triglycerides are much improved- keep up the good work.   Potassium is elevated.  Is he taking any supplements?  If so, please discontinue. Repeat bmet in 1 week, dx hyperkalemia.

## 2020-09-29 NOTE — Telephone Encounter (Signed)
Also, his kidney tests are mildly abnormal.  Is he using any regular anti-inflammatories? If so, please discontinue.

## 2020-10-01 NOTE — Telephone Encounter (Signed)
LMOM informing Pt to return call.  

## 2020-10-02 NOTE — Telephone Encounter (Signed)
Spoke w/ Pt- informed of results and recommendations. He has been on a new protein supplement recently, he is unsure if high in potassium, he will take a look when he gets home. Also, he does not routinely take NSAIDs only w/ headaches. He is schedule to recheck BMP on 10/09/20.

## 2020-10-09 ENCOUNTER — Other Ambulatory Visit: Payer: Self-pay

## 2020-10-09 ENCOUNTER — Other Ambulatory Visit (INDEPENDENT_AMBULATORY_CARE_PROVIDER_SITE_OTHER): Payer: BC Managed Care – PPO

## 2020-10-09 DIAGNOSIS — E875 Hyperkalemia: Secondary | ICD-10-CM

## 2020-10-10 ENCOUNTER — Other Ambulatory Visit: Payer: Self-pay

## 2020-10-10 ENCOUNTER — Other Ambulatory Visit (INDEPENDENT_AMBULATORY_CARE_PROVIDER_SITE_OTHER): Payer: BC Managed Care – PPO

## 2020-10-10 ENCOUNTER — Telehealth: Payer: Self-pay

## 2020-10-10 DIAGNOSIS — E785 Hyperlipidemia, unspecified: Secondary | ICD-10-CM

## 2020-10-10 DIAGNOSIS — Z23 Encounter for immunization: Secondary | ICD-10-CM

## 2020-10-10 NOTE — Telephone Encounter (Signed)
Terry Fox from [facility] called the office with a critical Potassium and states he thinks results are inaccurate due to patient labs being drawn 10/09/20 at 3:18 pm and sent to [facility] today due to last pick up at 2 pm. He suggest that the patient come in have labs repeated for accuracy.

## 2020-10-10 NOTE — Telephone Encounter (Signed)
Please contact pt and ask him to repeat lab work either in our office or at [facility] today. Needs bmet please dx hyperkalemia.

## 2020-10-10 NOTE — Telephone Encounter (Signed)
Patient is scheduled to come in today at 3:15 for repeat BMP

## 2020-10-11 ENCOUNTER — Telehealth: Payer: Self-pay | Admitting: Family

## 2020-10-11 DIAGNOSIS — N289 Disorder of kidney and ureter, unspecified: Secondary | ICD-10-CM

## 2020-10-11 LAB — BASIC METABOLIC PANEL
BUN: 38 mg/dL — ABNORMAL HIGH (ref 6–23)
CO2: 30 mEq/L (ref 19–32)
Calcium: 9.5 mg/dL (ref 8.4–10.5)
Chloride: 104 mEq/L (ref 96–112)
Creatinine, Ser: 1.6 mg/dL — ABNORMAL HIGH (ref 0.40–1.50)
GFR: 48.59 mL/min — ABNORMAL LOW (ref >60.00)
Glucose, Bld: 82 mg/dL (ref 70–99)
Potassium: 4.7 mEq/L (ref 3.5–5.1)
Sodium: 141 mEq/L (ref 135–145)

## 2020-10-11 NOTE — Telephone Encounter (Signed)
Called pt and informed of results and gave him number for him call  to schedule his u/s - JMA

## 2020-10-11 NOTE — Telephone Encounter (Signed)
Follow up potassium is normal.  His kidney function has worsened slightly. I would like for him to have an ultrasound of his kidneys, continue to avoid anti-inflammatories, drink plenty of fluids and continue to keep blood pressure at goal.

## 2020-10-15 ENCOUNTER — Ambulatory Visit (HOSPITAL_BASED_OUTPATIENT_CLINIC_OR_DEPARTMENT_OTHER)
Admission: RE | Admit: 2020-10-15 | Discharge: 2020-10-15 | Disposition: A | Discharge status: Home / Self Care | Payer: BC Managed Care – PPO | Source: Ambulatory Visit | Attending: Family | Admitting: Family

## 2020-10-15 ENCOUNTER — Other Ambulatory Visit: Payer: Self-pay

## 2020-10-15 DIAGNOSIS — N289 Disorder of kidney and ureter, unspecified: Secondary | ICD-10-CM | POA: Diagnosis present

## 2020-10-24 ENCOUNTER — Other Ambulatory Visit: Payer: Self-pay | Admitting: Family

## 2020-11-06 ENCOUNTER — Other Ambulatory Visit: Payer: Self-pay | Admitting: Family

## 2021-03-29 ENCOUNTER — Ambulatory Visit: Payer: BC Managed Care – PPO | Admitting: Family

## 2021-04-09 ENCOUNTER — Other Ambulatory Visit: Payer: Self-pay | Admitting: Family

## 2021-04-16 ENCOUNTER — Ambulatory Visit (HOSPITAL_BASED_OUTPATIENT_CLINIC_OR_DEPARTMENT_OTHER)
Admission: RE | Admit: 2021-04-16 | Discharge: 2021-04-16 | Disposition: A | Discharge status: Home / Self Care | Payer: BC Managed Care – PPO | Source: Ambulatory Visit | Attending: Family | Admitting: Family

## 2021-04-16 ENCOUNTER — Other Ambulatory Visit: Payer: Self-pay

## 2021-04-16 ENCOUNTER — Ambulatory Visit: Payer: BC Managed Care – PPO | Admitting: Family

## 2021-04-16 VITALS — BP 128/81 | HR 64 | Temp 98.7°F | Resp 16 | Wt 231.0 lb

## 2021-04-16 DIAGNOSIS — M25561 Pain in right knee: Secondary | ICD-10-CM | POA: Insufficient documentation

## 2021-04-16 DIAGNOSIS — E039 Hypothyroidism, unspecified: Secondary | ICD-10-CM | POA: Diagnosis not present

## 2021-04-16 DIAGNOSIS — M545 Low back pain, unspecified: Secondary | ICD-10-CM | POA: Diagnosis present

## 2021-04-16 DIAGNOSIS — M25562 Pain in left knee: Secondary | ICD-10-CM

## 2021-04-16 MED ORDER — MELOXICAM 7.5 MG PO TABS: 7.5000 mg | ORAL_TABLET | Freq: Every day | ORAL | 0 refills | Status: DC

## 2021-04-16 NOTE — Progress Notes (Signed)
Subjective:     Patient ID: Terry Fox, male    DOB: 07-01-1966, 55 y.o.   MRN: [id number]  Chief Complaint  Patient presents with   Back Pain    Patient complains of lower back pain    Knee Pain    Patient complain of bilateral knee pain    HPI  Patient is in today to discuss knee and back pain.   He has seen the [place] who recommended PT and chiropractic which did not really help. He reports that back pain has been present for several years.  Pain is localized to the low back.  It is worse when he stands still.  Pain is less if he is staying active/walking.  Typically he does not have pain with sitting, but the does use a lumbar support pillow.  He denies LE weakness except due to knee.  Bilateral knee pain- he has not seen orthopedics.  He has not had any x-rays.   Health Maintenance Due  Topic Date Due   Pneumococcal Vaccine 97-30 Years old (1 - PCV) Never done   COVID-19 Vaccine (3 - Booster for Moderna series) 10/01/2020   INFLUENZA VACCINE  04/15/2021    Past Medical History:  Diagnosis Date   Allergy    Arthritis    Depression    in his 20's   Generalized headaches    GERD (gastroesophageal reflux disease)    Hernia    Thyroid disease    hypothyroidism    Past Surgical History:  Procedure Laterality Date   HERNIA REPAIR  99991111   umbilical hernia repair-- [facility]    TONSILLECTOMY  14's    Family History  Problem Relation Age of Onset   Arthritis Father    Hyperlipidemia Mother    Peripheral vascular disease Mother    Diabetes Mellitus II Brother    Obesity Brother    Hip fracture Maternal Grandmother    Heart attack Maternal Grandfather        died age 43, smoker/obese   Dementia Paternal Grandmother    Alcohol abuse Paternal Grandfather    Hypertension Neg Hx    Sudden death Neg Hx    Colon cancer Neg Hx    Esophageal cancer Neg Hx    Pancreatic cancer Neg Hx    Prostate cancer Neg Hx    Rectal cancer Neg Hx    Stomach cancer Neg Hx      Social History   Socioeconomic History   Marital status: Married    Spouse name: Terry Fox   Number of children: 6   Years of education: Not on file   Highest education level: Associate degree: academic program  Occupational History   Occupation: Retired Company secretary  Tobacco Use   Smoking status: Former   Smokeless tobacco: Former    Types: Chew  Substance and Sexual Activity   Alcohol use: Yes    Alcohol/week: 12.0 standard drinks    Types: 12 Cans of beer per week    Comment: 1-2 beers a week   Drug use: No   Sexual activity: Yes  Other Topics Concern   Not on file  Social History Narrative   Regular exercise:  No   Caffeine Use:  Rarely   Works as a Theatre stage manager.   Married   2 biological children   4 step children      Patient is right-handed. He lives with his wife in a one level house. He has been avoiding caffeine for 4-5  months. He does not exercise.      Social Determinants of Health   Financial Resource Strain: Not on file  Food Insecurity: Not on file  Transportation Needs: Not on file  Physical Activity: Not on file  Stress: Not on file  Social Connections: Not on file  Intimate Partner Violence: Not on file    Outpatient Medications Prior to Visit  Medication Sig Dispense Refill   atorvastatin (LIPITOR) 10 MG tablet TAKE 1 TABLET BY MOUTH  DAILY 90 tablet 3   fenofibrate (TRICOR) 145 MG tablet Take 1 tablet (145 mg total) by mouth daily. 90 tablet 0   levothyroxine (SYNTHROID) 150 MCG tablet Take 1 tablet (150 mcg total) by mouth daily before breakfast. 90 tablet 1   omeprazole (PRILOSEC) 40 MG capsule TAKE 1 CAPSULE BY MOUTH  DAILY 90 capsule 3   verapamil (VERELAN) 100 MG 24 hr capsule TAKE 1 CAPSULE BY MOUTH AT  BEDTIME 90 capsule 3   flurbiprofen (ANSAID) 100 MG tablet Take 1 tablet every 8 hours as needed.  Maximum 3 tablets in 24h (Patient not taking: Reported on 09/28/2020) 24 tablet 3   Facility-Administered Medications Prior to Visit   Medication Dose Route Frequency Provider Last Rate Last Admin   0.9 %  sodium chloride infusion  500 mL Intravenous Continuous Armbruster, Carlota Raspberry, MD        No Known Allergies  ROS    See HPI  Objective:    Physical Exam Constitutional:      General: He is not in acute distress.    Appearance: He is well-developed.  HENT:     Head: Normocephalic and atraumatic.  Cardiovascular:     Rate and Rhythm: Normal rate and regular rhythm.     Heart sounds: No murmur heard. Pulmonary:     Effort: Pulmonary effort is normal. No respiratory distress.     Breath sounds: Normal breath sounds. No wheezing or rales.  Skin:    General: Skin is warm and dry.  Neurological:     Mental Status: He is alert and oriented to person, place, and time.  Psychiatric:        Behavior: Behavior normal.        Thought Content: Thought content normal.    BP 128/81 (BP Location: Right Arm, Patient Position: Sitting, Cuff Size: Large)   Pulse 64   Temp 98.7 F (37.1 C) (Oral)   Resp 16   Wt 231 lb (104.8 kg)   SpO2 99%   BMI 31.33 kg/m  Wt Readings from Last 3 Encounters:  04/16/21 231 lb (104.8 kg)  09/28/20 225 lb 9.6 oz (102.3 kg)  03/23/20 236 lb (107 kg)       Assessment & Plan:   Problem List Items Addressed This Visit       Unprioritized   Lower back pain - Primary    Long standing. No neurological deficits. Will give trial of meloxicam.  Recommended that he see if he can get an MRI ordered through the [place] as I think it will be difficult to get this approved by [employer] in the absence of neuro symptoms.        Relevant Medications   meloxicam (MOBIC) 7.5 MG tablet   Other Relevant Orders   DG Lumbar Spine Complete   Hypothyroidism    Obtained follow up TSH.  Continue synthroid '150mg'$  once daily.        Relevant Orders   TSH   Bilateral knee pain  Worsening. Will obtain x-rays. He declines referral to ortho at this time.        Relevant Orders   DG Knee Complete 4  Views Left   DG Knee Complete 4 Views Right    I have discontinued Terry Fox "Terry Fox"'s flurbiprofen and verapamil. I am also having him start on meloxicam. Additionally, I am having him maintain his omeprazole, atorvastatin, levothyroxine, and fenofibrate. We will continue to administer sodium chloride.  Meds ordered this encounter  Medications   meloxicam (MOBIC) 7.5 MG tablet    Sig: Take 1 tablet (7.5 mg total) by mouth daily.    Dispense:  21 tablet    Refill:  0    Order Specific Question:   Supervising Provider    Answer:   Penni Homans A [id number]

## 2021-04-16 NOTE — Assessment & Plan Note (Signed)
Worsening. Will obtain x-rays. He declines referral to ortho at this time.

## 2021-04-16 NOTE — Assessment & Plan Note (Signed)
Long standing. No neurological deficits. Will give trial of meloxicam.  Recommended that he see if he can get an MRI ordered through the [place] as I think it will be difficult to get this approved by [employer] in the absence of neuro symptoms.

## 2021-04-16 NOTE — Assessment & Plan Note (Signed)
Obtained follow up TSH.  Continue synthroid '150mg'$  once daily.

## 2021-04-17 LAB — TSH: TSH: 2.33 u[IU]/mL (ref 0.35–5.50)

## 2021-04-23 ENCOUNTER — Other Ambulatory Visit: Payer: Self-pay

## 2021-04-23 ENCOUNTER — Encounter: Payer: Self-pay | Admitting: Family

## 2021-04-23 ENCOUNTER — Other Ambulatory Visit: Payer: Self-pay | Admitting: Family

## 2021-05-25 ENCOUNTER — Other Ambulatory Visit: Payer: Self-pay | Admitting: Family

## 2021-07-04 ENCOUNTER — Other Ambulatory Visit: Payer: Self-pay | Admitting: Family

## 2021-07-19 ENCOUNTER — Ambulatory Visit: Payer: BC Managed Care – PPO | Admitting: Family

## 2021-10-06 ENCOUNTER — Other Ambulatory Visit: Payer: Self-pay | Admitting: Family

## 2021-10-14 DIAGNOSIS — E039 Hypothyroidism, unspecified: Secondary | ICD-10-CM | POA: Diagnosis not present

## 2021-10-14 DIAGNOSIS — Z23 Encounter for immunization: Secondary | ICD-10-CM | POA: Diagnosis not present

## 2021-10-14 DIAGNOSIS — E785 Hyperlipidemia, unspecified: Secondary | ICD-10-CM | POA: Diagnosis not present

## 2021-10-14 DIAGNOSIS — M19031 Primary osteoarthritis, right wrist: Secondary | ICD-10-CM | POA: Diagnosis not present

## 2021-10-14 DIAGNOSIS — K219 Gastro-esophageal reflux disease without esophagitis: Secondary | ICD-10-CM | POA: Diagnosis not present

## 2021-10-14 DIAGNOSIS — M25531 Pain in right wrist: Secondary | ICD-10-CM | POA: Diagnosis not present

## 2021-11-12 DIAGNOSIS — M654 Radial styloid tenosynovitis [de Quervain]: Secondary | ICD-10-CM | POA: Diagnosis not present

## 2021-11-19 DIAGNOSIS — R519 Headache, unspecified: Secondary | ICD-10-CM | POA: Diagnosis not present

## 2021-11-19 DIAGNOSIS — U071 COVID-19: Secondary | ICD-10-CM | POA: Diagnosis not present

## 2021-11-19 DIAGNOSIS — Z20828 Contact with and (suspected) exposure to other viral communicable diseases: Secondary | ICD-10-CM | POA: Diagnosis not present

## 2021-12-13 DIAGNOSIS — D631 Anemia in chronic kidney disease: Secondary | ICD-10-CM | POA: Diagnosis not present

## 2021-12-13 DIAGNOSIS — N179 Acute kidney failure, unspecified: Secondary | ICD-10-CM | POA: Diagnosis not present

## 2021-12-13 DIAGNOSIS — M899 Disorder of bone, unspecified: Secondary | ICD-10-CM | POA: Diagnosis not present

## 2021-12-13 DIAGNOSIS — N1831 Chronic kidney disease, stage 3a: Secondary | ICD-10-CM | POA: Diagnosis not present

## 2021-12-18 ENCOUNTER — Other Ambulatory Visit: Payer: Self-pay | Admitting: Family

## 2021-12-30 ENCOUNTER — Other Ambulatory Visit: Payer: Self-pay | Admitting: Family

## 2021-12-31 DIAGNOSIS — M654 Radial styloid tenosynovitis [de Quervain]: Secondary | ICD-10-CM | POA: Diagnosis not present

## 2022-01-16 DIAGNOSIS — N179 Acute kidney failure, unspecified: Secondary | ICD-10-CM | POA: Diagnosis not present

## 2022-01-16 DIAGNOSIS — N1831 Chronic kidney disease, stage 3a: Secondary | ICD-10-CM | POA: Diagnosis not present

## 2022-02-04 ENCOUNTER — Encounter: Payer: Self-pay | Admitting: Gastroenterology

## 2022-03-25 ENCOUNTER — Other Ambulatory Visit: Payer: Self-pay | Admitting: Family

## 2022-03-25 NOTE — Telephone Encounter (Signed)
Please contact pt to schedule a follow up visit.  

## 2022-03-26 ENCOUNTER — Other Ambulatory Visit: Payer: Self-pay | Admitting: Family

## 2022-04-01 ENCOUNTER — Telehealth: Payer: Self-pay | Admitting: Family

## 2022-04-01 ENCOUNTER — Ambulatory Visit: Payer: BC Managed Care – PPO | Admitting: Family

## 2022-04-01 VITALS — BP 121/72 | HR 63 | Temp 98.5°F | Resp 16 | Ht 72.0 in | Wt 237.0 lb

## 2022-04-01 DIAGNOSIS — K219 Gastro-esophageal reflux disease without esophagitis: Secondary | ICD-10-CM

## 2022-04-01 DIAGNOSIS — E039 Hypothyroidism, unspecified: Secondary | ICD-10-CM

## 2022-04-01 DIAGNOSIS — G43909 Migraine, unspecified, not intractable, without status migrainosus: Secondary | ICD-10-CM

## 2022-04-01 DIAGNOSIS — E785 Hyperlipidemia, unspecified: Secondary | ICD-10-CM | POA: Diagnosis not present

## 2022-04-01 MED ORDER — RIZATRIPTAN BENZOATE 10 MG PO TABS: 10.0000 mg | ORAL_TABLET | ORAL | 2 refills | Status: DC | PRN

## 2022-04-01 MED ORDER — FENOFIBRATE 145 MG PO TABS: 145.0000 mg | ORAL_TABLET | Freq: Every day | ORAL | 1 refills | Status: DC

## 2022-04-01 MED ORDER — VERAPAMIL HCL ER 100 MG PO CP24: 100.0000 mg | ORAL_CAPSULE | Freq: Every day | ORAL | 1 refills | Status: DC

## 2022-04-01 NOTE — Assessment & Plan Note (Signed)
Stable on omeprazole '40mg'$  once daily continue same.

## 2022-04-01 NOTE — Assessment & Plan Note (Addendum)
Reports headache for about a week.  Will give trial of maxalt. If symptoms fail to improve he will let me know and I will place a referral to neurology.

## 2022-04-01 NOTE — Assessment & Plan Note (Signed)
Lab Results  Component Value Date   CHOL 134 09/28/2020   HDL 36 (L) 09/28/2020   LDLCALC 80 09/28/2020   LDLDIRECT 75.0 03/27/2020   TRIG 97 09/28/2020   CHOLHDL 3.7 09/28/2020   Continue fenofibrate. Obtain follow up lipid panel.

## 2022-04-01 NOTE — Telephone Encounter (Signed)
Please request copy of his colonoscopy from last year which was done at the [facility] [place] in [place].

## 2022-04-01 NOTE — Telephone Encounter (Signed)
Records release form will be faxed

## 2022-04-01 NOTE — Progress Notes (Signed)
Subjective:   By signing my name below, I, Kellie Simmering, attest that this documentation has been prepared under the direction and in the presence of [name], NP 04/01/2022.   Patient ID: Terry Fox, male    DOB: 02-23-1966, 56 y.o.   MRN: [id number]  Chief Complaint  Patient presents with   Hypothyroidism    Here for follow up    HPI Patient is in today for an office visit.  Refills- He is requesting a refill on his Levothyroxine 150 mcg, Fenofibrate 145 mg and Verapamil 100 mg.  Migraines- He is currently taking Verapamil 100 mg to manage his migraines. He reports that the pain is worse in the morning and when he lays down to sleep. He has tried imitrex without improvement.  Weight- His weight has increased since his last visit. He reports that he has not been exercising and his diet has not been well maintained. However, he does state that he has not had a beer in 6 weeks. Wt Readings from Last 3 Encounters:  04/01/22 237 lb (107.5 kg)  04/16/21 231 lb (104.8 kg)  09/28/20 225 lb 9.6 oz (102.3 kg)   Fenofibrate- He is currently taking Fenofibrate 145 mg to manage his cholesterol.  Lab Results  Component Value Date   CHOL 134 09/28/2020   HDL 36 (L) 09/28/2020   LDLCALC 80 09/28/2020   LDLDIRECT 75.0 03/27/2020   TRIG 97 09/28/2020   CHOLHDL 3.7 09/28/2020   GERD- He reports that his GERD is well managed while taking Omeprazole 40 mg.   Colonoscopy- He reports that he received a colonoscopy last year. Need data.   Immunizations- He has received 2 COVID-19 vaccinations.  Sleep- He denies snoring.  Past Medical History:  Diagnosis Date   Allergy    Arthritis    Depression    in his 20's   Generalized headaches    GERD (gastroesophageal reflux disease)    Hernia    Thyroid disease    hypothyroidism    Past Surgical History:  Procedure Laterality Date   HERNIA REPAIR  0/7/37   umbilical hernia repair-- [facility]    TONSILLECTOMY  61's     Family History  Problem Relation Age of Onset   Arthritis Father    Hyperlipidemia Mother    Peripheral vascular disease Mother    Diabetes Mellitus II Brother    Obesity Brother    Hip fracture Maternal Grandmother    Heart attack Maternal Grandfather        died age 15, smoker/obese   Dementia Paternal Grandmother    Alcohol abuse Paternal Grandfather    Hypertension Neg Hx    Sudden death Neg Hx    Colon cancer Neg Hx    Esophageal cancer Neg Hx    Pancreatic cancer Neg Hx    Prostate cancer Neg Hx    Rectal cancer Neg Hx    Stomach cancer Neg Hx     Social History   Socioeconomic History   Marital status: Married    Spouse name: [name]   Number of children: 6   Years of education: Not on file   Highest education level: Associate degree: academic program  Occupational History   Occupation: Retired Company secretary  Tobacco Use   Smoking status: Former   Smokeless tobacco: Former    Types: Chew  Substance and Sexual Activity   Alcohol use: Yes    Alcohol/week: 12.0 standard drinks of alcohol    Types: 12 Cans of  beer per week    Comment: 1-2 beers a week   Drug use: No   Sexual activity: Yes  Other Topics Concern   Not on file  Social History Narrative   Regular exercise:  No   Caffeine Use:  Rarely   Works as a Theatre stage manager.   Married   2 biological children   4 step children      Patient is right-handed. He lives with his wife in a one level house. He has been avoiding caffeine for 4-5 months. He does not exercise.      Social Determinants of Health   Financial Resource Strain: Not on file  Food Insecurity: Not on file  Transportation Needs: Not on file  Physical Activity: Not on file  Stress: Not on file  Social Connections: Not on file  Intimate Partner Violence: Not on file    Outpatient Medications Prior to Visit  Medication Sig Dispense Refill   levothyroxine (SYNTHROID) 150 MCG tablet TAKE 1 TABLET BY MOUTH  DAILY BEFORE BREAKFAST 30 tablet  0   omeprazole (PRILOSEC) 40 MG capsule TAKE 1 CAPSULE BY MOUTH  DAILY 90 capsule 3   fenofibrate (TRICOR) 145 MG tablet TAKE 1 TABLET BY MOUTH  DAILY 90 tablet 1   verapamil (VERELAN) 100 MG 24 hr capsule TAKE 1 CAPSULE BY MOUTH AT  BEDTIME 90 capsule 1   atorvastatin (LIPITOR) 10 MG tablet TAKE 1 TABLET BY MOUTH  DAILY 90 tablet 3   meloxicam (MOBIC) 7.5 MG tablet Take 1 tablet (7.5 mg total) by mouth daily. 21 tablet 0   Facility-Administered Medications Prior to Visit  Medication Dose Route Frequency Provider Last Rate Last Admin   0.9 %  sodium chloride infusion  500 mL Intravenous Continuous Armbruster, Carlota Raspberry, MD        No Known Allergies  ROS See HPI    Objective:    Physical Exam Constitutional:      General: He is not in acute distress.    Appearance: Normal appearance. He is not ill-appearing.  HENT:     Head: Normocephalic and atraumatic.     Right Ear: External ear normal.     Left Ear: External ear normal.  Eyes:     Extraocular Movements: Extraocular movements intact.     Pupils: Pupils are equal, round, and reactive to light.  Cardiovascular:     Rate and Rhythm: Normal rate and regular rhythm.     Pulses: Normal pulses.     Heart sounds: Normal heart sounds. No murmur heard.    No gallop.  Pulmonary:     Effort: No respiratory distress.     Breath sounds: No wheezing or rales.  Skin:    General: Skin is warm and dry.  Neurological:     Mental Status: He is alert and oriented to person, place, and time.  Psychiatric:        Mood and Affect: Mood normal.        Behavior: Behavior normal.        Judgment: Judgment normal.     BP 121/72 (BP Location: Right Arm, Patient Position: Sitting, Cuff Size: Large)   Pulse 63   Temp 98.5 F (36.9 C) (Oral)   Resp 16   Ht 6' (1.829 m)   Wt 237 lb (107.5 kg)   SpO2 97%   BMI 32.14 kg/m  Wt Readings from Last 3 Encounters:  04/01/22 237 lb (107.5 kg)  04/16/21 231 lb (104.8 kg)  09/28/20  225 lb 9.6 oz  (102.3 kg)       Assessment & Plan:   Problem List Items Addressed This Visit       Unprioritized   Migraine    Reports headache for about a week.  Will give trial of maxalt. If symptoms fail to improve he will let me know and I will place a referral to neurology.       Relevant Medications   rizatriptan (MAXALT) 10 MG tablet   verapamil (VERELAN) 100 MG 24 hr capsule   fenofibrate (TRICOR) 145 MG tablet   Hypothyroidism - Primary    Lab Results  Component Value Date   TSH 2.33 04/16/2021   Wt Readings from Last 3 Encounters:  04/01/22 237 lb (107.5 kg)  04/16/21 231 lb (104.8 kg)  09/28/20 225 lb 9.6 oz (102.3 kg)  He has had some weight gain.  Continues synthroid. Check TSH.       Relevant Orders   TSH   Hyperlipidemia    Lab Results  Component Value Date   CHOL 134 09/28/2020   HDL 36 (L) 09/28/2020   LDLCALC 80 09/28/2020   LDLDIRECT 75.0 03/27/2020   TRIG 97 09/28/2020   CHOLHDL 3.7 09/28/2020  Continue fenofibrate. Obtain follow up lipid panel.       Relevant Medications   verapamil (VERELAN) 100 MG 24 hr capsule   fenofibrate (TRICOR) 145 MG tablet   Other Relevant Orders   Lipid panel   Comp Met (CMET)   GERD    Stable on omeprazole 86m once daily continue same.       Meds ordered this encounter  Medications   rizatriptan (MAXALT) 10 MG tablet    Sig: Take 1 tablet (10 mg total) by mouth as needed for migraine (may repeat in 2 hours x 1 if needed. Max 2 tabs/24 hrs). May repeat in 2 hours if needed    Dispense:  10 tablet    Refill:  2    Order Specific Question:   Supervising Provider    Answer:   BPenni HomansA [[id number]]   verapamil (VERELAN) 100 MG 24 hr capsule    Sig: Take 1 capsule (100 mg total) by mouth at bedtime.    Dispense:  90 capsule    Refill:  1    Requesting 1 year supply    Order Specific Question:   Supervising Provider    Answer:   BPenni HomansA [[id number]]   fenofibrate (TRICOR) 145 MG tablet    Sig: Take 1 tablet (145 mg  total) by mouth daily.    Dispense:  90 tablet    Refill:  1    Requesting 1 year supply    Order Specific Question:   Supervising Provider    Answer:   BPenni HomansA [[id number]]    I, MNance Pear NP, personally preformed the services described in this documentation.  All medical record entries made by the scribe were at my direction and in my presence.  I have reviewed the chart and discharge instructions (if applicable) and agree that the record reflects my personal performance and is accurate and complete. 04/01/2022.  I,Mohammed Iqbal,acting as a sEducation administratorfor [employer] NP.,have documented all relevant documentation on the behalf of MNance Pear NP,as directed by  MNance Pear NP while in the presence of MNance Pear NP.  MNance Pear NP

## 2022-04-01 NOTE — Assessment & Plan Note (Addendum)
Lab Results  Component Value Date   TSH 2.33 04/16/2021   Wt Readings from Last 3 Encounters:  04/01/22 237 lb (107.5 kg)  04/16/21 231 lb (104.8 kg)  09/28/20 225 lb 9.6 oz (102.3 kg)   He has had some weight gain.  Continues synthroid. Check TSH.

## 2022-04-02 LAB — COMPREHENSIVE METABOLIC PANEL
ALT: 32 U/L (ref 0–53)
AST: 27 U/L (ref 0–37)
Albumin: 4.6 g/dL (ref 3.5–5.2)
Alkaline Phosphatase: 61 U/L (ref 39–117)
BUN: 24 mg/dL — ABNORMAL HIGH (ref 6–23)
CO2: 27 mEq/L (ref 19–32)
Calcium: 9.4 mg/dL (ref 8.4–10.5)
Chloride: 104 mEq/L (ref 96–112)
Creatinine, Ser: 1.49 mg/dL (ref 0.40–1.50)
GFR: 52.38 mL/min — ABNORMAL LOW (ref >60.00)
Glucose, Bld: 130 mg/dL — ABNORMAL HIGH (ref 70–99)
Potassium: 4.6 mEq/L (ref 3.5–5.1)
Sodium: 140 mEq/L (ref 135–145)
Total Bilirubin: 0.5 mg/dL (ref 0.2–1.2)
Total Protein: 7.1 g/dL (ref 6.0–8.3)

## 2022-04-02 LAB — LIPID PANEL
Cholesterol: 179 mg/dL (ref 0–200)
HDL: 28.5 mg/dL — ABNORMAL LOW (ref >39.00)
NonHDL: 150.82
Total CHOL/HDL Ratio: 6
Triglycerides: 246 mg/dL — ABNORMAL HIGH (ref 0.0–149.0)
VLDL: 49.2 mg/dL — ABNORMAL HIGH (ref 0.0–40.0)

## 2022-04-02 LAB — LDL CHOLESTEROL, DIRECT: Direct LDL: 131 mg/dL

## 2022-04-02 LAB — TSH: TSH: 3.84 u[IU]/mL (ref 0.35–5.50)

## 2022-04-03 ENCOUNTER — Other Ambulatory Visit: Payer: Self-pay | Admitting: Family

## 2022-04-03 MED ORDER — LEVOTHYROXINE SODIUM 150 MCG PO TABS: 150.0000 ug | ORAL_TABLET | Freq: Every day | ORAL | 1 refills | Status: DC

## 2022-04-11 DIAGNOSIS — R400 Somnolence: Secondary | ICD-10-CM | POA: Diagnosis not present

## 2022-04-11 DIAGNOSIS — Z461 Encounter for fitting and adjustment of hearing aid: Secondary | ICD-10-CM | POA: Diagnosis not present

## 2022-04-11 DIAGNOSIS — R519 Headache, unspecified: Secondary | ICD-10-CM | POA: Diagnosis not present

## 2022-04-11 DIAGNOSIS — M791 Myalgia, unspecified site: Secondary | ICD-10-CM | POA: Diagnosis not present

## 2022-05-16 DIAGNOSIS — N1831 Chronic kidney disease, stage 3a: Secondary | ICD-10-CM | POA: Diagnosis not present

## 2022-05-16 DIAGNOSIS — R030 Elevated blood-pressure reading, without diagnosis of hypertension: Secondary | ICD-10-CM | POA: Diagnosis not present

## 2022-05-16 DIAGNOSIS — E559 Vitamin D deficiency, unspecified: Secondary | ICD-10-CM | POA: Diagnosis not present

## 2022-05-20 ENCOUNTER — Encounter: Payer: Self-pay | Admitting: Family

## 2022-05-20 NOTE — Telephone Encounter (Signed)
Patient was scheduled to come in 9/12 for follow up and will bring forms

## 2022-05-27 ENCOUNTER — Ambulatory Visit: Payer: BC Managed Care – PPO | Admitting: Family

## 2022-05-27 VITALS — BP 134/81 | HR 68 | Temp 98.6°F | Resp 16 | Wt 240.0 lb

## 2022-05-27 DIAGNOSIS — H9319 Tinnitus, unspecified ear: Secondary | ICD-10-CM | POA: Diagnosis not present

## 2022-05-27 DIAGNOSIS — G43909 Migraine, unspecified, not intractable, without status migrainosus: Secondary | ICD-10-CM

## 2022-05-27 MED ORDER — RIZATRIPTAN BENZOATE 10 MG PO TABS: 10.0000 mg | ORAL_TABLET | ORAL | 5 refills | Status: DC | PRN

## 2022-05-27 NOTE — Assessment & Plan Note (Signed)
Uncontrolled. Will refer to neurology for consulation. Refilled maxalt.

## 2022-05-27 NOTE — Assessment & Plan Note (Signed)
Unchanged, despite use of hearing aids. Monitor.

## 2022-05-27 NOTE — Progress Notes (Signed)
Subjective:   By signing my name below, I, Terry Fox, attest that this documentation has been prepared under the direction and in the presence of Terry Chimera, NP 05/27/2022   Patient ID: Terry Fox, male    DOB: [birth year], 56 y.o.   MRN: [id number]  No chief complaint on file.   HPI Patient is in today for an office visit  Refill: He is requesting a refill of 10 Mg of Rizatriptan.  Migraines/Tinnitus: He has chronic migraines and is requesting to fill out disability papers for [employer] in regards to the relationship between his tinnitus and migraines. Since leaving the [employer], he has received hearing aids. When listening to low music or sounds, his tinnitus symptoms improve but other than that, they worsen when exposed to non low sounds. He is currently taking 10 Mg of Rizatriptan. The medication alleviates some of his symptoms. He recorded the frequency of his headaches and reports that he experiences symptoms about every other day. He is not seeing a neurologist at the moment and is interested in being referred to one.   Health Maintenance Due  Topic Date Due   COVID-19 Vaccine (3 - Moderna series) 06/26/2020   COLONOSCOPY (Pts 45-53yr Insurance coverage will need to be confirmed)  12/31/2021   INFLUENZA VACCINE  04/15/2022    Past Medical History:  Diagnosis Date   Allergy    Arthritis    Depression    in his 20's   Generalized headaches    GERD (gastroesophageal reflux disease)    Hernia    Thyroid disease    hypothyroidism    Past Surgical History:  Procedure Laterality Date   HERNIA REPAIR  47/4/08  umbilical hernia repair-- [facility]    TONSILLECTOMY  89's   Family History  Problem Relation Age of Onset   Arthritis Father    Hyperlipidemia Mother    Peripheral vascular disease Mother    Diabetes Mellitus II Brother    Obesity Brother    Hip fracture Maternal Grandmother    Heart attack Maternal Grandfather        died age 56 smoker/obese    Dementia Paternal Grandmother    Alcohol abuse Paternal Grandfather    Hypertension Neg Hx    Sudden death Neg Hx    Colon cancer Neg Hx    Esophageal cancer Neg Hx    Pancreatic cancer Neg Hx    Prostate cancer Neg Hx    Rectal cancer Neg Hx    Stomach cancer Neg Hx     Social History   Socioeconomic History   Marital status: Married    Spouse name: Terry Fox   Number of children: 6   Years of education: Not on file   Highest education level: Associate degree: academic program  Occupational History   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 He lives with his wife in a one level house. He has been avoiding caffeine for 4-5 months. He does not exercise.      Social Determinants of Health   Financial Resource Strain: Not on file  Food Insecurity: Not on file  Transportation Needs: Not on file  Physical Activity: Not on file  Stress: Not on file  Social Connections: Not on file  Intimate Partner Violence: Not on file    Outpatient Medications Prior to Visit  Medication Sig Dispense Refill   fenofibrate (TRICOR) 145 MG tablet Take 1 tablet (145 mg total) by mouth daily. 90 tablet 1   levothyroxine (SYNTHROID) 150 MCG tablet Take 1 tablet (150 mcg total) by mouth daily before breakfast. 90 tablet 1   omeprazole (PRILOSEC) 40 MG capsule TAKE 1 CAPSULE BY MOUTH  DAILY 90 capsule 3   verapamil (VERELAN) 100 MG 24 hr capsule Take 1 capsule (100 mg total) by mouth at bedtime. 90  capsule 1   rizatriptan (MAXALT) 10 MG tablet Take 1 tablet (10 mg total) by mouth as needed for migraine (may repeat in 2 hours x 1 if needed. Max 2 tabs/24 hrs). May repeat in 2 hours if needed 10 tablet 2   Facility-Administered Medications Prior to Visit  Medication Dose Route Frequency Provider Last Rate Last Admin   0.9 %  sodium chloride infusion  500 mL Intravenous Continuous Armbruster, Carlota Raspberry, MD        No Known Allergies  ROS See HPI    Objective:    Physical Exam Constitutional:      General: He is not in acute distress.    Appearance: Normal appearance. He is not ill-appearing.  HENT:     Head: Normocephalic and atraumatic.     Right Ear: External ear normal.     Left Ear: External ear normal.  Eyes:     Extraocular Movements: Extraocular movements intact.     Pupils: Pupils are equal, round, and reactive to light.  Cardiovascular:     Rate and Rhythm: Normal rate and regular rhythm.     Heart sounds: Normal heart sounds. No murmur heard.    No gallop.  Pulmonary:     Effort: Pulmonary effort is normal. No respiratory distress.     Breath sounds: Normal breath sounds. No wheezing or rales.  Skin:    General: Skin is warm and dry.  Neurological:     Mental Status: He is alert and oriented to person, place, and time.  Psychiatric:        Mood and Affect: Mood normal.        Behavior: Behavior normal.        Judgment: Judgment normal.     BP 134/81 (BP Location: Right Arm, Patient Position: Sitting, Cuff Size: Large)   Pulse 68   Temp 98.6 F (37 C) (Oral)   Resp 16   Wt 240 lb (108.9 kg)   SpO2 99%   BMI 32.55 kg/m  Wt Readings from Last 3 Encounters:  05/27/22 240 lb (108.9 kg)  04/01/22 237 lb (107.5 kg)  04/16/21 231 lb (104.8 kg)       Assessment & Plan:   Problem List Items Addressed This Visit       Unprioritized   Tinnitus    Unchanged, despite use of hearing aids. Monitor.       Migraine - Primary    Uncontrolled. Will refer  to neurology for consulation. Refilled maxalt.  Relevant Medications   rizatriptan (MAXALT) 10 MG tablet   Other Relevant Orders   Ambulatory referral to Neurology   Meds ordered this encounter  Medications   rizatriptan (MAXALT) 10 MG tablet    Sig: Take 1 tablet (10 mg total) by mouth as needed for migraine (may repeat in 2 hours x 1 if needed. Max 2 tabs/24 hrs). May repeat in 2 hours if needed    Dispense:  10 tablet    Refill:  5    Order Specific Question:   Supervising Provider    Answer:   Penni Homans A [[id number]]    I, Nance Pear, NP, personally preformed the services described in this documentation.  All medical record entries made by the scribe were at my direction and in my presence.  I have reviewed the chart and discharge instructions (if applicable) and agree that the record reflects my personal performance and is accurate and complete. 05/27/2022   I,Amber Collins,acting as a Education administrator for Nance Pear, NP.,have documented all relevant documentation on the behalf of Nance Pear, NP,as directed by  Nance Pear, NP while in the presence of Nance Pear, NP.    Nance Pear, NP

## 2022-05-30 DIAGNOSIS — Z0279 Encounter for issue of other medical certificate: Secondary | ICD-10-CM

## 2022-06-03 ENCOUNTER — Encounter: Payer: Self-pay | Admitting: Neurology

## 2022-06-03 DIAGNOSIS — Z7189 Other specified counseling: Secondary | ICD-10-CM | POA: Diagnosis not present

## 2022-07-24 ENCOUNTER — Other Ambulatory Visit: Payer: Self-pay | Admitting: Family

## 2022-08-28 ENCOUNTER — Ambulatory Visit: Payer: BC Managed Care – PPO | Admitting: Neurology

## 2022-08-31 DIAGNOSIS — R400 Somnolence: Secondary | ICD-10-CM | POA: Diagnosis not present

## 2022-10-03 ENCOUNTER — Other Ambulatory Visit: Payer: Self-pay | Admitting: Family

## 2022-10-06 DIAGNOSIS — R2241 Localized swelling, mass and lump, right lower limb: Secondary | ICD-10-CM | POA: Diagnosis not present

## 2022-10-06 DIAGNOSIS — K219 Gastro-esophageal reflux disease without esophagitis: Secondary | ICD-10-CM | POA: Diagnosis not present

## 2022-10-06 DIAGNOSIS — M25761 Osteophyte, right knee: Secondary | ICD-10-CM | POA: Diagnosis not present

## 2022-10-06 DIAGNOSIS — G43909 Migraine, unspecified, not intractable, without status migrainosus: Secondary | ICD-10-CM | POA: Diagnosis not present

## 2022-10-06 DIAGNOSIS — M25721 Osteophyte, right elbow: Secondary | ICD-10-CM | POA: Diagnosis not present

## 2022-10-06 DIAGNOSIS — M7731 Calcaneal spur, right foot: Secondary | ICD-10-CM | POA: Diagnosis not present

## 2022-10-06 DIAGNOSIS — M25722 Osteophyte, left elbow: Secondary | ICD-10-CM | POA: Diagnosis not present

## 2022-10-07 ENCOUNTER — Encounter: Payer: BC Managed Care – PPO | Admitting: Family

## 2022-10-20 DIAGNOSIS — M7710 Lateral epicondylitis, unspecified elbow: Secondary | ICD-10-CM | POA: Diagnosis not present

## 2022-10-28 DIAGNOSIS — R070 Pain in throat: Secondary | ICD-10-CM | POA: Diagnosis not present

## 2022-10-28 DIAGNOSIS — R0981 Nasal congestion: Secondary | ICD-10-CM | POA: Diagnosis not present

## 2022-10-28 DIAGNOSIS — J069 Acute upper respiratory infection, unspecified: Secondary | ICD-10-CM | POA: Diagnosis not present

## 2022-10-28 DIAGNOSIS — R051 Acute cough: Secondary | ICD-10-CM | POA: Diagnosis not present

## 2022-11-25 ENCOUNTER — Ambulatory Visit: Payer: BC Managed Care – PPO | Admitting: Family

## 2022-11-25 ENCOUNTER — Encounter: Payer: Self-pay | Admitting: Family

## 2022-11-25 ENCOUNTER — Telehealth: Payer: Self-pay | Admitting: Family

## 2022-11-25 VITALS — BP 122/74 | HR 64 | Temp 98.0°F | Resp 16 | Wt 229.0 lb

## 2022-11-25 DIAGNOSIS — G43909 Migraine, unspecified, not intractable, without status migrainosus: Secondary | ICD-10-CM | POA: Diagnosis not present

## 2022-11-25 DIAGNOSIS — E785 Hyperlipidemia, unspecified: Secondary | ICD-10-CM

## 2022-11-25 DIAGNOSIS — E039 Hypothyroidism, unspecified: Secondary | ICD-10-CM

## 2022-11-25 DIAGNOSIS — K2270 Barrett's esophagus without dysplasia: Secondary | ICD-10-CM | POA: Insufficient documentation

## 2022-11-25 MED ORDER — RIZATRIPTAN BENZOATE 10 MG PO TABS: 10.0000 mg | ORAL_TABLET | ORAL | 5 refills | Status: DC | PRN

## 2022-11-25 MED ORDER — OMEPRAZOLE 40 MG PO CPDR: 40.0000 mg | DELAYED_RELEASE_CAPSULE | Freq: Every day | ORAL | 3 refills | Status: DC

## 2022-11-25 NOTE — Progress Notes (Signed)
Subjective:   By signing my name below, I, Terry Fox, attest that this documentation has been prepared under the direction and in the presence of Terry Alar, NP. 11/25/2022.   Patient ID: Terry Fox, male    DOB: 03-23-1966, 57 y.o.   MRN: [id number]  Chief Complaint  Patient presents with   Hypothyroidism    Here for follow up   Gastroesophageal Reflux    Here for follow up    HPI Patient is in today for an office visit.  Hypothyroidism:  He states he is feeling fine on his current dose of 150 mcg synthroid daily.  Acid reflux:  He struggles with recurrent acid reflux. He does admit to using too much hot sauce which he enjoys. Also he reports a new diagnosis of Barrett's esophagus. Currently taking 40 mg omeprazole.   Migraines:  At his last visit 05/2022 he complained of chronic migraines and was referred to neurology. He continues to have 3-4 migraines a week. He is scheduled for a follow-up at the [place].  Blood pressure:  Blood pressure is well controlled today. For a while he had noticed readings in the 140s/80s. BP Readings from Last 3 Encounters:  11/25/22 122/74  05/27/22 134/81  04/01/22 121/72   Kidney disease:  He endorses a diagnosis of stage III CKD.  Colonoscopy:  He notes that his last colonoscopy was completed at the [place] in [place] 2 years ago. Repeat colonoscopy will be scheduled in 3 more years.   Past Medical History:  Diagnosis Date   Allergy    Arthritis    Barrett's esophagus    Depression    in his 20's   Generalized headaches    GERD (gastroesophageal reflux disease)    Hernia    Thyroid disease    hypothyroidism    Past Surgical History:  Procedure Laterality Date   HERNIA REPAIR  [id number]   umbilical hernia repair-- [facility]    TONSILLECTOMY  77's    Family History  Problem Relation Age of Onset   Arthritis Father    Hyperlipidemia Mother    Peripheral vascular disease Mother    Diabetes Mellitus II Brother     Obesity Brother    Hip fracture Maternal Grandmother    Heart attack Maternal Grandfather        died age 26, smoker/obese   Dementia Paternal Grandmother    Alcohol abuse Paternal Grandfather    Hypertension Neg Hx    Sudden death Neg Hx    Colon cancer Neg Hx    Esophageal cancer Neg Hx    Pancreatic cancer Neg Hx    Prostate cancer Neg Hx    Rectal cancer Neg Hx    Stomach cancer Neg Hx     Social History   Socioeconomic History   Marital status: Married    Spouse name: Terry Fox   Number of children: 6   Years of education: Not on file   Highest education level: Associate degree: academic program  Occupational History   Occupation: Retired Company secretary  Tobacco Use   Smoking status: Former   Smokeless tobacco: Former    Types: Chew  Substance and Sexual Activity   Alcohol use: Yes    Alcohol/week: 12.0 standard drinks of alcohol    Types: 12 Cans of beer per week    Comment: 1-2 beers a week   Drug use: No   Sexual activity: Yes  Other Topics Concern   Not on file  Social History Narrative  Regular exercise:  No   Caffeine Use:  Rarely   Works as a Theatre stage manager.   Married   2 biological children   4 step children      Patient is right-handed. He lives with his wife in a one level house. He has been avoiding caffeine for 4-5 months. He does not exercise.      Social Determinants of Health   Financial Resource Strain: Not on file  Food Insecurity: Not on file  Transportation Needs: Not on file  Physical Activity: Not on file  Stress: Not on file  Social Connections: Not on file  Intimate Partner Violence: Not on file    Outpatient Medications Prior to Visit  Medication Sig Dispense Refill   fenofibrate (TRICOR) 145 MG tablet TAKE 1 TABLET BY MOUTH DAILY 90 tablet 3   levothyroxine (SYNTHROID) 150 MCG tablet TAKE 1 TABLET BY MOUTH DAILY  BEFORE BREAKFAST 90 tablet 1   omeprazole (PRILOSEC) 40 MG capsule TAKE 1 CAPSULE BY MOUTH  DAILY 90 capsule 3    rizatriptan (MAXALT) 10 MG tablet Take 1 tablet (10 mg total) by mouth as needed for migraine (may repeat in 2 hours x 1 if needed. Max 2 tabs/24 hrs). May repeat in 2 hours if needed 10 tablet 5   verapamil (VERELAN) 100 MG 24 hr capsule TAKE 1 CAPSULE BY MOUTH AT  BEDTIME 90 capsule 3   Facility-Administered Medications Prior to Visit  Medication Dose Route Frequency Provider Last Rate Last Admin   0.9 %  sodium chloride infusion  500 mL Intravenous Continuous Armbruster, Carlota Raspberry, MD        No Known Allergies  Review of Systems  Gastrointestinal:  Positive for heartburn.  Neurological:  Positive for headaches (Chronic migraines).       Objective:    Physical Exam Constitutional:      General: He is not in acute distress.    Appearance: Normal appearance. He is not ill-appearing.  HENT:     Head: Normocephalic and atraumatic.     Right Ear: Tympanic membrane, ear canal and external ear normal.     Left Ear: Tympanic membrane, ear canal and external ear normal.  Eyes:     Extraocular Movements: Extraocular movements intact.     Pupils: Pupils are equal, round, and reactive to light.  Cardiovascular:     Rate and Rhythm: Normal rate and regular rhythm.     Heart sounds: Normal heart sounds. No murmur heard.    No gallop.  Pulmonary:     Effort: Pulmonary effort is normal. No respiratory distress.     Breath sounds: Normal breath sounds. No wheezing or rales.  Skin:    General: Skin is warm and dry.  Neurological:     General: No focal deficit present.     Mental Status: He is alert and oriented to person, place, and time.  Psychiatric:        Mood and Affect: Mood normal.        Behavior: Behavior normal.     BP 122/74 (BP Location: Right Arm, Patient Position: Sitting, Cuff Size: Large)   Pulse 64   Temp 98 F (36.7 C) (Oral)   Resp 16   Wt 229 lb (103.9 kg)   SpO2 98%   BMI 31.06 kg/m  Wt Readings from Last 3 Encounters:  11/25/22 229 lb (103.9 kg)   05/27/22 240 lb (108.9 kg)  04/01/22 237 lb (107.5 kg)  Assessment & Plan:   Problem List Items Addressed This Visit       Unprioritized   Migraine    Has 2-4 migraines/week.  Uncontrolled. Has minimal relief with maxalt. He will see neurology for consultation soon at the [facility] [place].       Hypothyroidism - Primary    Clinically stable on synthroid.  Continue same.       Relevant Orders   TSH   Hyperlipidemia    Lab Results  Component Value Date   CHOL 179 04/01/2022   HDL 28.50 (L) 04/01/2022   LDLCALC 80 09/28/2020   LDLDIRECT 131.0 04/01/2022   TRIG 246.0 (H) 04/01/2022   CHOLHDL 6 04/01/2022  He is maintained on fenofibrate- he will return fasting for lab work.       Relevant Orders   Comp Met (CMET)   Lipid panel   Barrett's esophagus    This was diagnosed recently at the [facility].  He is maintained on omeprazole.         No orders of the defined types were placed in this encounter.   I, Nance Pear, NP, personally preformed the services described in this documentation.  All medical record entries made by the scribe were at my direction and in my presence.  I have reviewed the chart and discharge instructions (if applicable) and agree that the record reflects my personal performance and is accurate and complete.  11/25/2022.  I,Mathew Stumpf,acting as a Education administrator for [employer], NP.,have documented all relevant documentation on the behalf of Nance Pear, NP,as directed by  Nance Pear, NP while in the presence of Nance Pear, NP.   Nance Pear, NP

## 2022-11-25 NOTE — Assessment & Plan Note (Signed)
Clinically stable on synthroid. Continue same.  

## 2022-11-25 NOTE — Assessment & Plan Note (Addendum)
Has 2-4 migraines/week.  Uncontrolled. Has minimal relief with maxalt. He will see neurology for consultation soon at the [facility].

## 2022-11-25 NOTE — Assessment & Plan Note (Signed)
This was diagnosed recently at the [place].  He is maintained on omeprazole.

## 2022-11-25 NOTE — Assessment & Plan Note (Addendum)
Lab Results  Component Value Date   CHOL 179 04/01/2022   HDL 28.50 (L) 04/01/2022   LDLCALC 80 09/28/2020   LDLDIRECT 131.0 04/01/2022   TRIG 246.0 (H) 04/01/2022   CHOLHDL 6 04/01/2022   He is maintained on fenofibrate- he will return fasting for lab work.

## 2022-11-25 NOTE — Telephone Encounter (Signed)
Please call the [facility] in [place] to request colonoscopy report.

## 2022-11-26 NOTE — Telephone Encounter (Signed)
Electronic request made for test results

## 2022-12-04 ENCOUNTER — Other Ambulatory Visit (INDEPENDENT_AMBULATORY_CARE_PROVIDER_SITE_OTHER): Payer: BC Managed Care – PPO

## 2022-12-04 DIAGNOSIS — E039 Hypothyroidism, unspecified: Secondary | ICD-10-CM | POA: Diagnosis not present

## 2022-12-04 DIAGNOSIS — E785 Hyperlipidemia, unspecified: Secondary | ICD-10-CM | POA: Diagnosis not present

## 2022-12-04 LAB — COMPREHENSIVE METABOLIC PANEL
ALT: 40 U/L (ref 0–53)
AST: 38 U/L — ABNORMAL HIGH (ref 0–37)
Albumin: 4.5 g/dL (ref 3.5–5.2)
Alkaline Phosphatase: 60 U/L (ref 39–117)
BUN: 17 mg/dL (ref 6–23)
CO2: 29 mEq/L (ref 19–32)
Calcium: 9.5 mg/dL (ref 8.4–10.5)
Chloride: 104 mEq/L (ref 96–112)
Creatinine, Ser: 1.48 mg/dL (ref 0.40–1.50)
GFR: 52.56 mL/min — ABNORMAL LOW (ref >60.00)
Glucose, Bld: 91 mg/dL (ref 70–99)
Potassium: 4.7 mEq/L (ref 3.5–5.1)
Sodium: 139 mEq/L (ref 135–145)
Total Bilirubin: 0.7 mg/dL (ref 0.2–1.2)
Total Protein: 7.3 g/dL (ref 6.0–8.3)

## 2022-12-04 LAB — LIPID PANEL
Cholesterol: 193 mg/dL (ref 0–200)
HDL: 29.9 mg/dL — ABNORMAL LOW
NonHDL: 163.18
Total CHOL/HDL Ratio: 6
Triglycerides: 201 mg/dL — ABNORMAL HIGH (ref 0.0–149.0)
VLDL: 40.2 mg/dL — ABNORMAL HIGH (ref 0.0–40.0)

## 2022-12-04 LAB — LDL CHOLESTEROL, DIRECT: Direct LDL: 141 mg/dL

## 2022-12-05 LAB — TSH: TSH: 1.56 u[IU]/mL (ref 0.35–5.50)

## 2022-12-11 DIAGNOSIS — M7712 Lateral epicondylitis, left elbow: Secondary | ICD-10-CM | POA: Diagnosis not present

## 2022-12-11 DIAGNOSIS — M7711 Lateral epicondylitis, right elbow: Secondary | ICD-10-CM | POA: Diagnosis not present

## 2022-12-18 DIAGNOSIS — M7711 Lateral epicondylitis, right elbow: Secondary | ICD-10-CM | POA: Diagnosis not present

## 2022-12-18 DIAGNOSIS — M7712 Lateral epicondylitis, left elbow: Secondary | ICD-10-CM | POA: Diagnosis not present

## 2023-01-07 DIAGNOSIS — F4389 Other reactions to severe stress: Secondary | ICD-10-CM | POA: Diagnosis not present

## 2023-01-20 DIAGNOSIS — K859 Acute pancreatitis without necrosis or infection, unspecified: Secondary | ICD-10-CM | POA: Diagnosis not present

## 2023-01-20 DIAGNOSIS — M546 Pain in thoracic spine: Secondary | ICD-10-CM | POA: Diagnosis not present

## 2023-01-20 DIAGNOSIS — K760 Fatty (change of) liver, not elsewhere classified: Secondary | ICD-10-CM | POA: Diagnosis not present

## 2023-01-20 DIAGNOSIS — M545 Low back pain, unspecified: Secondary | ICD-10-CM | POA: Diagnosis not present

## 2023-01-20 DIAGNOSIS — M549 Dorsalgia, unspecified: Secondary | ICD-10-CM | POA: Diagnosis not present

## 2023-01-20 DIAGNOSIS — R1032 Left lower quadrant pain: Secondary | ICD-10-CM | POA: Diagnosis not present

## 2023-01-20 DIAGNOSIS — R109 Unspecified abdominal pain: Secondary | ICD-10-CM | POA: Diagnosis not present

## 2023-01-26 DIAGNOSIS — K859 Acute pancreatitis without necrosis or infection, unspecified: Secondary | ICD-10-CM | POA: Diagnosis not present

## 2023-01-26 DIAGNOSIS — K219 Gastro-esophageal reflux disease without esophagitis: Secondary | ICD-10-CM | POA: Diagnosis not present

## 2023-01-26 DIAGNOSIS — K760 Fatty (change of) liver, not elsewhere classified: Secondary | ICD-10-CM | POA: Diagnosis not present

## 2023-01-26 DIAGNOSIS — K2270 Barrett's esophagus without dysplasia: Secondary | ICD-10-CM | POA: Diagnosis not present

## 2023-01-27 ENCOUNTER — Encounter: Payer: Self-pay | Admitting: Family

## 2023-01-27 DIAGNOSIS — M7712 Lateral epicondylitis, left elbow: Secondary | ICD-10-CM | POA: Diagnosis not present

## 2023-01-27 DIAGNOSIS — M7711 Lateral epicondylitis, right elbow: Secondary | ICD-10-CM | POA: Diagnosis not present

## 2023-01-28 DIAGNOSIS — R162 Hepatomegaly with splenomegaly, not elsewhere classified: Secondary | ICD-10-CM | POA: Diagnosis not present

## 2023-01-28 DIAGNOSIS — K219 Gastro-esophageal reflux disease without esophagitis: Secondary | ICD-10-CM | POA: Diagnosis not present

## 2023-01-28 DIAGNOSIS — K760 Fatty (change of) liver, not elsewhere classified: Secondary | ICD-10-CM | POA: Diagnosis not present

## 2023-01-28 DIAGNOSIS — K859 Acute pancreatitis without necrosis or infection, unspecified: Secondary | ICD-10-CM | POA: Diagnosis not present

## 2023-01-30 DIAGNOSIS — N1831 Chronic kidney disease, stage 3a: Secondary | ICD-10-CM | POA: Diagnosis not present

## 2023-01-30 DIAGNOSIS — N250 Renal osteodystrophy: Secondary | ICD-10-CM | POA: Diagnosis not present

## 2023-02-17 ENCOUNTER — Other Ambulatory Visit: Payer: Self-pay | Admitting: Family

## 2023-04-30 ENCOUNTER — Encounter (INDEPENDENT_AMBULATORY_CARE_PROVIDER_SITE_OTHER): Payer: Self-pay

## 2023-05-29 ENCOUNTER — Encounter: Payer: BC Managed Care – PPO | Admitting: Family

## 2023-06-01 ENCOUNTER — Telehealth: Payer: Self-pay | Admitting: Family

## 2023-06-01 NOTE — Telephone Encounter (Signed)
[facility] called stating that they have a manufacturer change on pt's levothyroxine and needs approval to dispense.  Ref: [id number]

## 2023-06-01 NOTE — Telephone Encounter (Signed)
Pharmacy informed ok per provider to change manufacturer

## 2023-06-08 DIAGNOSIS — F6381 Intermittent explosive disorder: Secondary | ICD-10-CM | POA: Diagnosis not present

## 2023-06-19 ENCOUNTER — Other Ambulatory Visit: Payer: Self-pay | Admitting: Family

## 2023-06-23 ENCOUNTER — Telehealth: Payer: Self-pay | Admitting: Family

## 2023-06-23 ENCOUNTER — Ambulatory Visit (INDEPENDENT_AMBULATORY_CARE_PROVIDER_SITE_OTHER): Payer: BC Managed Care – PPO | Admitting: Family

## 2023-06-23 ENCOUNTER — Encounter: Payer: Self-pay | Admitting: Family

## 2023-06-23 VITALS — BP 135/78 | HR 57 | Temp 97.6°F | Resp 16 | Ht 72.0 in | Wt 233.0 lb

## 2023-06-23 DIAGNOSIS — K2270 Barrett's esophagus without dysplasia: Secondary | ICD-10-CM | POA: Diagnosis not present

## 2023-06-23 DIAGNOSIS — E039 Hypothyroidism, unspecified: Secondary | ICD-10-CM | POA: Diagnosis not present

## 2023-06-23 DIAGNOSIS — E785 Hyperlipidemia, unspecified: Secondary | ICD-10-CM | POA: Diagnosis not present

## 2023-06-23 DIAGNOSIS — Z0000 Encounter for general adult medical examination without abnormal findings: Secondary | ICD-10-CM | POA: Diagnosis not present

## 2023-06-23 DIAGNOSIS — G43909 Migraine, unspecified, not intractable, without status migrainosus: Secondary | ICD-10-CM | POA: Diagnosis not present

## 2023-06-23 MED ORDER — TOPIRAMATE 25 MG PO TABS
75.0000 mg | ORAL_TABLET | Freq: Every day | ORAL | Status: AC
Start: 2023-06-23 — End: ?

## 2023-06-23 NOTE — Assessment & Plan Note (Signed)
Discussed healthy diet, exercise and weight loss.  Encouraged pt to get covid booster at his pharmacy. He believes that his colonoscopy is up to date at the [place]. Will request copy of that report.

## 2023-06-23 NOTE — Progress Notes (Signed)
Subjective:     Patient ID: Terry Fox, male    DOB: 1966-07-10, 57 y.o.   MRN: [id number]  Chief Complaint  Patient presents with   Annual Exam    HPI  Discussed the use of AI scribe software for clinical note transcription with the patient, who gave verbal consent to proceed.  History of Present Illness          Patient presents today for complete physical.  Immunizations: flu shot up to date Diet:  fair Wt Readings from Last 3 Encounters:  06/23/23 233 lb (105.7 kg)  11/25/22 229 lb (103.9 kg)  05/27/22 240 lb (108.9 kg)    Exercise:  some walking during breaks at work, mows the yard Colonoscopy: up to date at the [place] [facility]: up to date Dental: up to date.   Migraines- maintained on verapamil.   BP Readings from Last 3 Encounters:  06/23/23 135/78  11/25/22 122/74  05/27/22 134/81   Hypothyroid- on synthroid 150 mcg.  Lab Results  Component Value Date   TSH 1.56 12/04/2022    Hyperlipidemia- maintained on fenofibrate.  Lab Results  Component Value Date   CHOL 193 12/04/2022   HDL 29.90 (L) 12/04/2022   LDLCALC 80 09/28/2020   LDLDIRECT 141.0 12/04/2022   TRIG 201.0 (H) 12/04/2022   CHOLHDL 6 12/04/2022       Health Maintenance Due  Topic Date Due   Colonoscopy  12/31/2021   COVID-19 Vaccine (5 - 2023-24 season) 05/17/2023    Past Medical History:  Diagnosis Date   Allergy    Arthritis    Barrett's esophagus    Depression    in his 20's   Generalized headaches    GERD (gastroesophageal reflux disease)    Hernia    Thyroid disease    hypothyroidism    Past Surgical History:  Procedure Laterality Date   HERNIA REPAIR  12/21/13   umbilical hernia repair-- [facility]    TONSILLECTOMY  1980's    Family History  Problem Relation Age of Onset   Arthritis Father    Hyperlipidemia Mother    Peripheral vascular disease Mother    Diabetes Mellitus II Brother    Obesity Brother    Hip fracture Maternal Grandmother    Heart attack  Maternal Grandfather        died age 77, smoker/obese   Dementia Paternal Grandmother    Alcohol abuse Paternal Grandfather    Hypertension Neg Hx    Sudden death Neg Hx    Colon cancer Neg Hx    Esophageal cancer Neg Hx    Pancreatic cancer Neg Hx    Prostate cancer Neg Hx    Rectal cancer Neg Hx    Stomach cancer Neg Hx     Social History   Socioeconomic History   Marital status: Married    Spouse name: Terry Fox   Number of children: 6   Years of education: Not on file   Highest education level: Associate degree: academic program  Occupational History   Occupation: Retired Arts development officer  Tobacco Use   Smoking status: Former   Smokeless tobacco: Former    Types: Chew  Substance and Sexual Activity   Alcohol use: Yes    Alcohol/week: 1.0 standard drink of alcohol    Types: 1 Cans of beer per week    Comment: 1-2 beers a month   Drug use: No   Sexual activity: Yes  Other Topics Concern   Not on file  Social  History Narrative   Regular exercise:  No   Caffeine Use:  Rarely   Works as a Armed forces logistics/support/administrative officer.   Married   2 biological children   4 step children      Patient is right-handed. He lives with his wife in a one level house. He has been avoiding caffeine for 4-5 months. He does not exercise.      Social Determinants of Health   Financial Resource Strain: Not on file  Food Insecurity: No Food Insecurity (03/07/2021)   Received from [facility], [facility]   Hunger Vital Sign    Worried About Running Out of Food in the Last Year: Never true    Ran Out of Food in the Last Year: Never true  Transportation Needs: Not on file  Physical Activity: Not on file  Stress: Not on file  Social Connections: Unknown (01/28/2022)   Received from [facility], [facility]   Social Network    Social Network: Not on file  Intimate Partner Violence: Not At Risk (01/20/2023)   Received from [facility], [facility]   HITS    Over the last 12 months how often did your partner  physically hurt you?: 1    Over the last 12 months how often did your partner insult you or talk down to you?: 1    Over the last 12 months how often did your partner threaten you with physical harm?: 1    Over the last 12 months how often did your partner scream or curse at you?: 1    Outpatient Medications Prior to Visit  Medication Sig Dispense Refill   fenofibrate (TRICOR) 145 MG tablet Take 1 tablet (145 mg total) by mouth daily. 90 tablet 1   levothyroxine (SYNTHROID) 150 MCG tablet Take 1 tablet (150 mcg total) by mouth daily before breakfast. 90 tablet 1   omeprazole (PRILOSEC) 40 MG capsule Take 1 capsule (40 mg total) by mouth daily. 90 capsule 3   rizatriptan (MAXALT) 10 MG tablet TAKE 1 TABLET BY MOUTH AS NEEDED FOR MIGRAINE . MAY REPEAT IN 2  HOURS ONCE IF NEEDED (MAX 2  TABLETS IN 24 HOURS) 10 tablet 5   verapamil (VERELAN) 100 MG 24 hr capsule Take 1 capsule (100 mg total) by mouth at bedtime. 90 capsule 1   0.9 %  sodium chloride infusion      No facility-administered medications prior to visit.    No Known Allergies  Review of Systems  Constitutional:  Negative for weight loss.  HENT:  Negative for congestion and hearing loss.   Eyes:  Negative for blurred vision.  Respiratory:  Negative for cough.   Cardiovascular:  Negative for leg swelling (mild foot swelling at the end of the day).  Gastrointestinal:  Negative for constipation and diarrhea.  Genitourinary:  Negative for dysuria.  Musculoskeletal:  Negative for joint pain and myalgias.  Skin:  Negative for rash.  Neurological:  Positive for headaches ([facility] Just [facility]).  Psychiatric/Behavioral:         Denies depression/anxiety       Objective:    Physical Exam   BP 135/78 (BP Location: Right Arm, Patient Position: Sitting, Cuff Size: Large)   Pulse (!) 57   Temp 97.6 F (36.4 C) (Oral)   Resp 16   Ht 6' (1.829 m)   Wt 233 lb (105.7 kg)   SpO2 100%   BMI  31.60 kg/m  Wt Readings  from Last 3 Encounters:  06/23/23 233 lb (105.7 kg)  11/25/22 229 lb (103.9 kg)  05/27/22 240 lb (108.9 kg)  Physical Exam  Constitutional: He is oriented to person, place, and time. He appears well-developed and well-nourished. No distress.  HENT:  Head: Normocephalic and atraumatic.  Right Ear: Tympanic membrane and ear canal normal.  Left Ear: Tympanic membrane and ear canal normal.  Mouth/Throat: Oropharynx is clear and moist.  Eyes: Pupils are equal, round, and reactive to light. No scleral icterus.  Neck: Normal range of motion. No thyromegaly present.  Cardiovascular: Normal rate and regular rhythm.   No murmur heard. Pulmonary/Chest: Effort normal and breath sounds normal. No respiratory distress. He has no wheezes. He has no rales. He exhibits no tenderness.  Abdominal: Soft. Bowel sounds are normal. He exhibits no distension and no mass. There is no tenderness. There is no rebound and no guarding.  Musculoskeletal: He exhibits no edema.  Lymphadenopathy:    He has no cervical adenopathy.  Neurological: He is alert and oriented to person, place, and time. He has normal patellar reflexes. He exhibits normal muscle tone. Coordination normal.  Skin: Skin is warm and dry.  Psychiatric: He has a normal mood and affect. His behavior is normal. Judgment and thought content normal.           Assessment & Plan:        Assessment & Plan:   Problem List Items Addressed This Visit       Unprioritized   Preventative health care    Discussed healthy diet, exercise and weight loss.  Encouraged pt to get covid booster at his pharmacy. He believes that his colonoscopy is up to date at the [place]. Will request copy of that report.          Migraine    Uncontrolled. Being treated by neuro at the [place]. Hopefully the topamax that was recently added will help his symptoms.       Relevant Medications   topiramate (TOPAMAX) 25 MG tablet   Hypothyroidism -  Primary    Clinically stable on synthroid.  Update TSH.      Relevant Orders   TSH   Hyperlipidemia    Continue dietary adjustment for elevated triglycerides. Continue fenofibrate.       Barrett's esophagus    This is being managed by GI at the [facility] as well as daily omeprazole.       I am having Terry T. Earlene Plater "[name]" start on topiramate. I am also having him maintain his omeprazole, rizatriptan, fenofibrate, levothyroxine, and verapamil. We will stop administering sodium chloride.  Meds ordered this encounter  Medications   topiramate (TOPAMAX) 25 MG tablet    Sig: Take 3 tablets (75 mg total) by mouth daily.    Order Specific Question:   Supervising Provider    Answer:   Danise Edge A [[id number]]

## 2023-06-23 NOTE — Telephone Encounter (Signed)
[facility] contact [facility] in [place] and request copy of last colonoscopy/endoscopy.

## 2023-06-23 NOTE — Assessment & Plan Note (Signed)
Continue dietary adjustment for elevated triglycerides. Continue fenofibrate.

## 2023-06-23 NOTE — Assessment & Plan Note (Signed)
Uncontrolled. Being treated by neuro at the [place]. Hopefully the topamax that was recently added will help his symptoms.

## 2023-06-23 NOTE — Assessment & Plan Note (Signed)
Clinically stable on synthroid. Update TSH.

## 2023-06-23 NOTE — Assessment & Plan Note (Signed)
This is being managed by GI at the [facility] as well as daily omeprazole.

## 2023-06-24 LAB — TSH: TSH: 2.15 u[IU]/mL (ref 0.35–5.50)

## 2023-06-24 NOTE — Telephone Encounter (Signed)
Electronic request submited

## 2023-10-27 ENCOUNTER — Other Ambulatory Visit: Payer: Self-pay | Admitting: Family

## 2023-11-16 ENCOUNTER — Other Ambulatory Visit: Payer: Self-pay | Admitting: Family

## 2023-12-25 ENCOUNTER — Ambulatory Visit: Payer: BC Managed Care – PPO | Admitting: Family

## 2024-01-15 ENCOUNTER — Encounter: Payer: Self-pay | Admitting: Gastroenterology
